# Patient Record
Sex: Female | Born: 1950 | Race: White | Hispanic: No | Smoking: Never smoker
Health system: Southern US, Community
[De-identification: ages and names within clinical notes are randomized; demographics above are authoritative.]

## PROBLEM LIST (undated history)

## (undated) DIAGNOSIS — T8859XA Other complications of anesthesia, initial encounter: Secondary | ICD-10-CM

## (undated) DIAGNOSIS — Z9889 Other specified postprocedural states: Secondary | ICD-10-CM

## (undated) DIAGNOSIS — Z8489 Family history of other specified conditions: Secondary | ICD-10-CM

## (undated) DIAGNOSIS — I728 Aneurysm of other specified arteries: Secondary | ICD-10-CM

## (undated) DIAGNOSIS — K760 Fatty (change of) liver, not elsewhere classified: Secondary | ICD-10-CM

## (undated) DIAGNOSIS — K219 Gastro-esophageal reflux disease without esophagitis: Secondary | ICD-10-CM

## (undated) DIAGNOSIS — J45909 Unspecified asthma, uncomplicated: Secondary | ICD-10-CM

## (undated) DIAGNOSIS — I4891 Unspecified atrial fibrillation: Secondary | ICD-10-CM

## (undated) DIAGNOSIS — F418 Other specified anxiety disorders: Secondary | ICD-10-CM

## (undated) HISTORY — DX: Unspecified atrial fibrillation: I48.91

## (undated) HISTORY — DX: Aneurysm of other specified arteries: I72.8

---

## 2021-02-22 ENCOUNTER — Other Ambulatory Visit: Payer: Self-pay

## 2021-02-22 ENCOUNTER — Emergency Department: Payer: Medicare Other

## 2021-02-22 ENCOUNTER — Observation Stay
Admission: EM | Admit: 2021-02-22 | Discharge: 2021-02-23 | Disposition: A | Discharge status: Home / Self Care | Payer: Medicare Other | Attending: Family Medicine | Admitting: Family Medicine

## 2021-02-22 DIAGNOSIS — Z79899 Other long term (current) drug therapy: Secondary | ICD-10-CM | POA: Insufficient documentation

## 2021-02-22 DIAGNOSIS — Z20822 Contact with and (suspected) exposure to covid-19: Secondary | ICD-10-CM | POA: Insufficient documentation

## 2021-02-22 DIAGNOSIS — I728 Aneurysm of other specified arteries: Secondary | ICD-10-CM | POA: Diagnosis not present

## 2021-02-22 DIAGNOSIS — J45909 Unspecified asthma, uncomplicated: Secondary | ICD-10-CM | POA: Diagnosis present

## 2021-02-22 DIAGNOSIS — K219 Gastro-esophageal reflux disease without esophagitis: Secondary | ICD-10-CM | POA: Diagnosis not present

## 2021-02-22 DIAGNOSIS — R109 Unspecified abdominal pain: Secondary | ICD-10-CM | POA: Diagnosis present

## 2021-02-22 HISTORY — DX: Gastro-esophageal reflux disease without esophagitis: K21.9

## 2021-02-22 HISTORY — DX: Unspecified asthma, uncomplicated: J45.909

## 2021-02-22 LAB — URINALYSIS, COMPLETE (UACMP) WITH MICROSCOPIC
Bacteria, UA: NONE SEEN
Bilirubin Urine: NEGATIVE
Glucose, UA: NEGATIVE mg/dL
Hgb urine dipstick: NEGATIVE
Ketones, ur: NEGATIVE mg/dL
Leukocytes,Ua: NEGATIVE
Nitrite: NEGATIVE
Protein, ur: NEGATIVE mg/dL
Specific Gravity, Urine: 1.004 — ABNORMAL LOW (ref 1.005–1.030)
Squamous Epithelial / HPF: NONE SEEN (ref 0–5)
pH: 6 (ref 5.0–8.0)

## 2021-02-22 LAB — CBC
HCT: 36.7 % (ref 36.0–46.0)
Hemoglobin: 12.1 g/dL (ref 12.0–15.0)
MCH: 29.9 pg (ref 26.0–34.0)
MCHC: 33 g/dL (ref 30.0–36.0)
MCV: 90.6 fL (ref 80.0–100.0)
Platelets: 266 10*3/uL (ref 150–400)
RBC: 4.05 MIL/uL (ref 3.87–5.11)
RDW: 12.5 % (ref 11.5–15.5)
WBC: 7.9 10*3/uL (ref 4.0–10.5)
nRBC: 0 % (ref 0.0–0.2)

## 2021-02-22 LAB — BASIC METABOLIC PANEL
Anion gap: 6 (ref 5–15)
BUN: 18 mg/dL (ref 8–23)
CO2: 26 mmol/L (ref 22–32)
Calcium: 9.1 mg/dL (ref 8.9–10.3)
Chloride: 104 mmol/L (ref 98–111)
Creatinine, Ser: 0.99 mg/dL (ref 0.44–1.00)
GFR, Estimated: >60 mL/min (ref >60)
Glucose, Bld: 117 mg/dL — ABNORMAL HIGH (ref 70–99)
Potassium: 3.8 mmol/L (ref 3.5–5.1)
Sodium: 136 mmol/L (ref 135–145)

## 2021-02-22 MED ORDER — OXYCODONE-ACETAMINOPHEN 5-325 MG PO TABS
1.0000 | ORAL_TABLET | Freq: Once | ORAL | Status: AC
  Administered 2021-02-22: 1 via ORAL
  Filled 2021-02-22: qty 1

## 2021-02-22 NOTE — ED Triage Notes (Signed)
Pt arrives via [facility] from home. Per report by medic, Left sided back pain x 2 days, steadily gotten worse. HR 76, BP 138/51, 99% RA. 12 lead unremarkable, pain to palpation. HX of diverticulitis.

## 2021-02-22 NOTE — ED Notes (Signed)
Patient transported to CT 

## 2021-02-22 NOTE — ED Triage Notes (Signed)
Pt presents to ER c/o left flank pain that started yesterday but has become worse today.  Pt denies having kidney stones in past.  Pt states pain radiates from left flank into abdomen.  Pt states her urine has developed a bad odor to it today.  Pt A&O x4 at this time.  Pt appears uncomfortable.

## 2021-02-23 ENCOUNTER — Encounter: Payer: Self-pay | Admitting: Radiology

## 2021-02-23 ENCOUNTER — Emergency Department: Payer: Medicare Other

## 2021-02-23 DIAGNOSIS — I728 Aneurysm of other specified arteries: Principal | ICD-10-CM

## 2021-02-23 DIAGNOSIS — K219 Gastro-esophageal reflux disease without esophagitis: Secondary | ICD-10-CM | POA: Diagnosis present

## 2021-02-23 DIAGNOSIS — J45909 Unspecified asthma, uncomplicated: Secondary | ICD-10-CM | POA: Diagnosis present

## 2021-02-23 LAB — BASIC METABOLIC PANEL
Anion gap: 4 — ABNORMAL LOW (ref 5–15)
BUN: 15 mg/dL (ref 8–23)
CO2: 30 mmol/L (ref 22–32)
Calcium: 9 mg/dL (ref 8.9–10.3)
Chloride: 104 mmol/L (ref 98–111)
Creatinine, Ser: 0.82 mg/dL (ref 0.44–1.00)
GFR, Estimated: >60 mL/min (ref >60)
Glucose, Bld: 104 mg/dL — ABNORMAL HIGH (ref 70–99)
Potassium: 4.1 mmol/L (ref 3.5–5.1)
Sodium: 138 mmol/L (ref 135–145)

## 2021-02-23 LAB — RESP PANEL BY RT-PCR (FLU A&B, COVID) ARPGX2
Influenza A by PCR: NEGATIVE
Influenza B by PCR: NEGATIVE
SARS Coronavirus 2 by RT PCR: NEGATIVE

## 2021-02-23 LAB — CBC
HCT: 36.5 % (ref 36.0–46.0)
Hemoglobin: 12.4 g/dL (ref 12.0–15.0)
MCH: 30.7 pg (ref 26.0–34.0)
MCHC: 34 g/dL (ref 30.0–36.0)
MCV: 90.3 fL (ref 80.0–100.0)
Platelets: 256 10*3/uL (ref 150–400)
RBC: 4.04 MIL/uL (ref 3.87–5.11)
RDW: 12.5 % (ref 11.5–15.5)
WBC: 6.7 10*3/uL (ref 4.0–10.5)
nRBC: 0 % (ref 0.0–0.2)

## 2021-02-23 LAB — HIV ANTIBODY (ROUTINE TESTING W REFLEX): HIV Screen 4th Generation wRfx: NONREACTIVE

## 2021-02-23 IMAGING — CT CT CTA ABD/PEL W/CM AND/OR W/O CM
2 of 9 series · 12 of 46 positions shown, 17 images · IV contrast (omnipaque)
Comparison: None.

CLINICAL DATA: Left upper quadrant abdominal pain, left flank pain

EXAM:
CTA ABDOMEN AND PELVIS WITHOUT AND WITH CONTRAST
TECHNIQUE: Multidetector CT imaging of the abdomen and pelvis was performed
using the standard protocol during bolus administration of
intravenous contrast. Multiplanar reconstructed images and MIPs were
obtained and reviewed to evaluate the vascular anatomy.
CONTRAST:  100mL OMNIPAQUE IOHEXOL 350 MG/ML SOLN

[Series 6: axial venous · axial · portal-venous · 0.69mm/px · z∈[-1171,-753]mm · 10 of 248 slices shown, 15 images]
[im 20/248  soft-tissue]
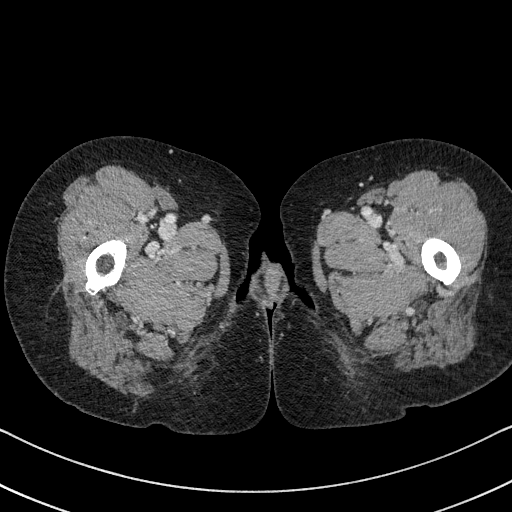
[im 20/248  bone]
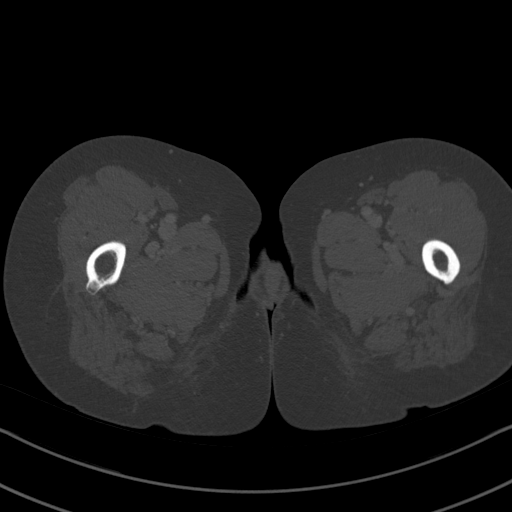
[im 58/248  soft-tissue]
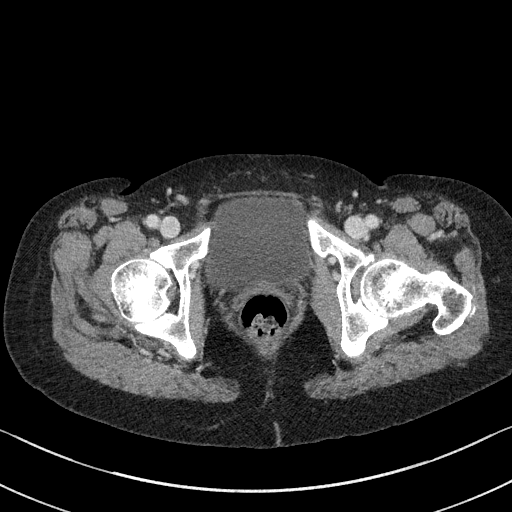
[im 77/248  soft-tissue]
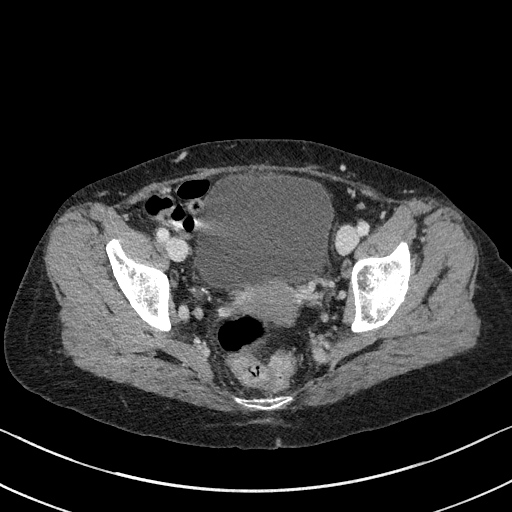
[im 96/248  soft-tissue]
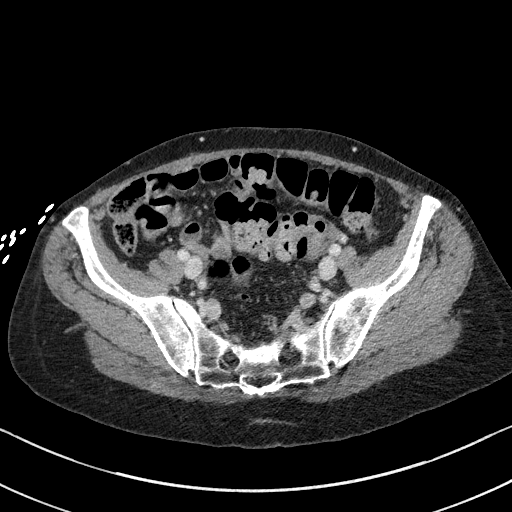
[im 134/248  soft-tissue]
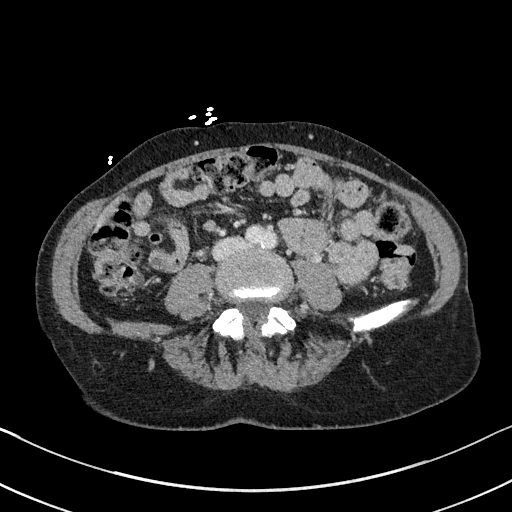
[im 153/248  soft-tissue]
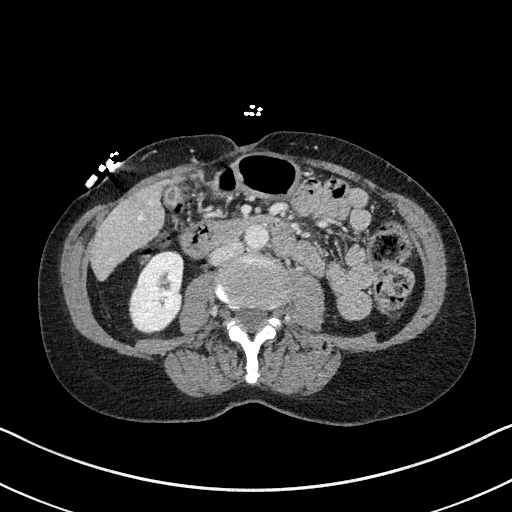
[im 172/248  soft-tissue]
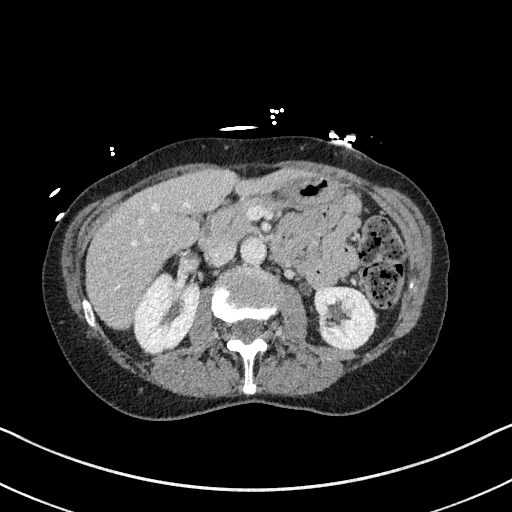
[im 172/248  lung]
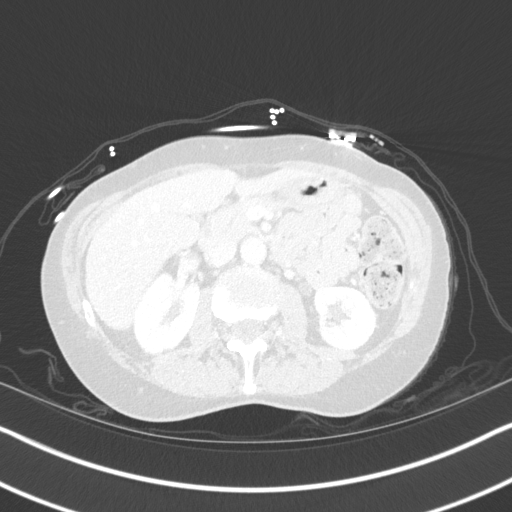
[im 191/248  lung]
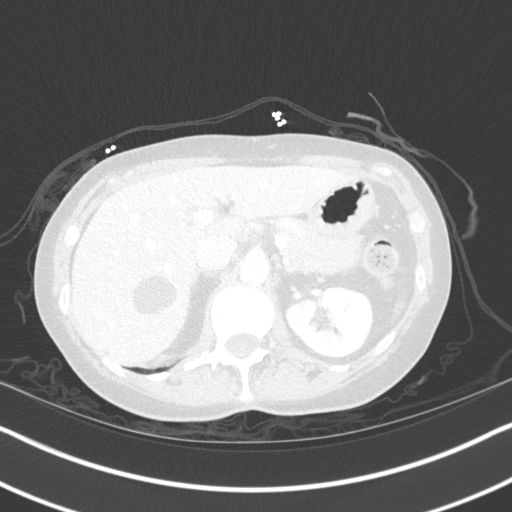
[im 210/248  soft-tissue]
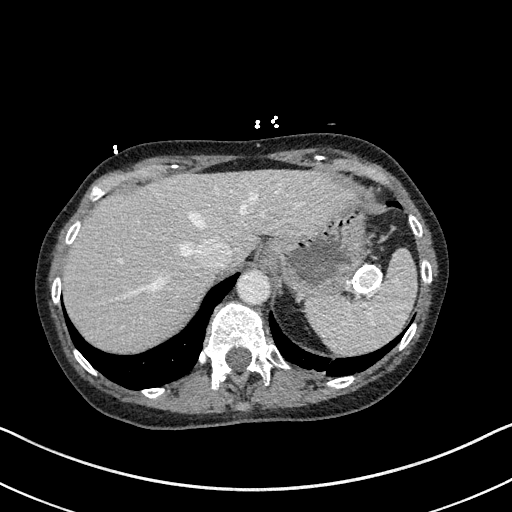
[im 210/248  lung]
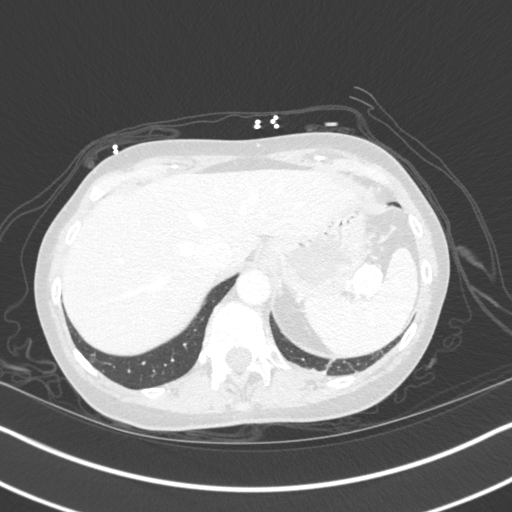
[im 229/248  soft-tissue]
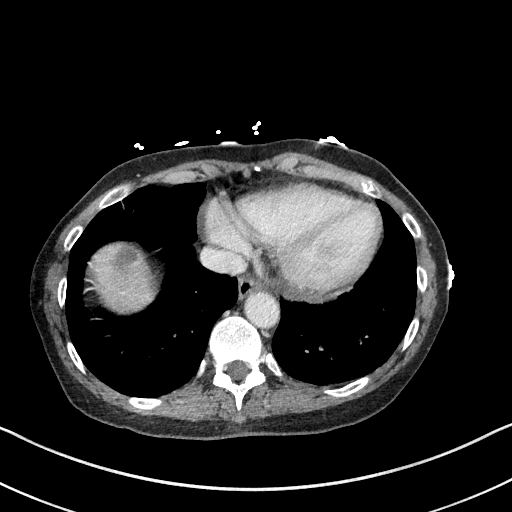
[im 229/248  lung]
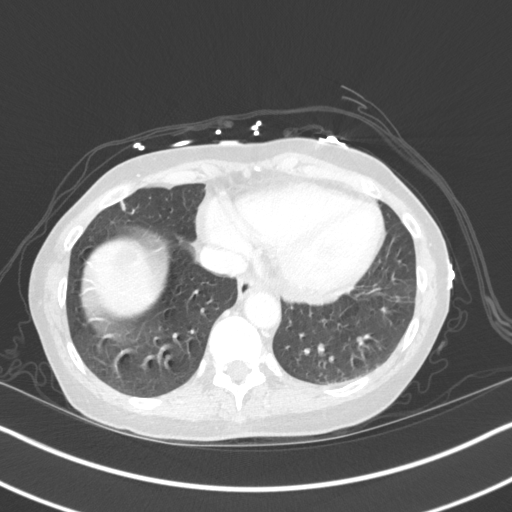
[im 229/248  bone]
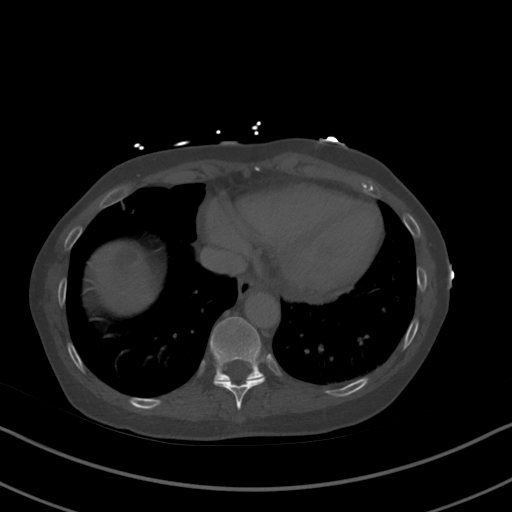

[Series 8: coronal mpr · coronal · 0.61mm/px · 2 of 108 slices shown]
[im 36/108  soft-tissue]
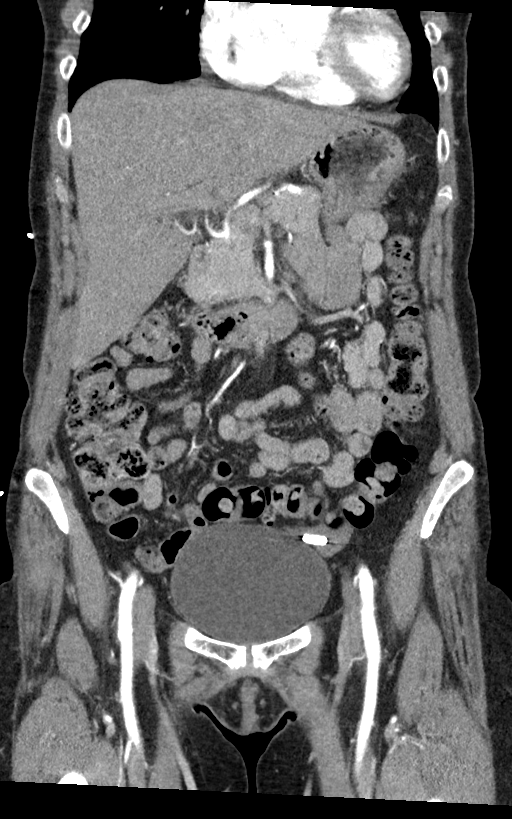
[im 72/108  soft-tissue]
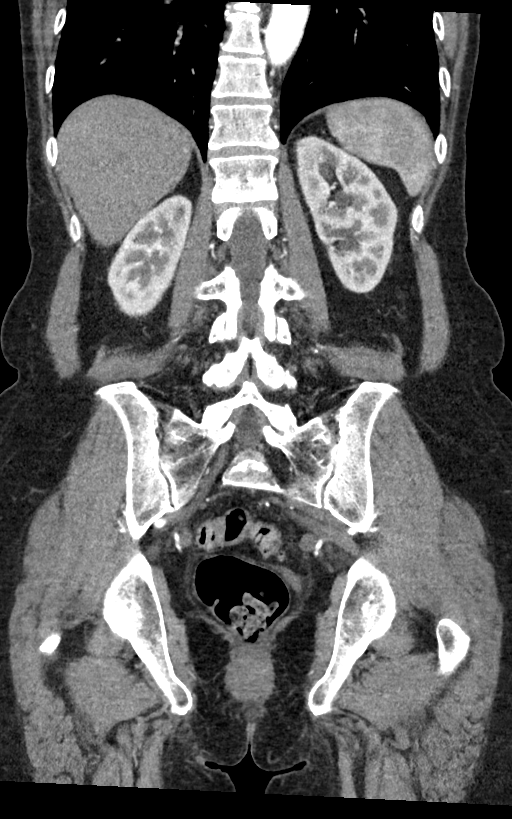

[12 of 46 positions shown; findings below may reference images not displayed]

FINDINGS: VASCULAR

Aorta: The abdominal aorta is of normal caliber. No aneurysm or
dissection. No periaortic inflammatory change.

Celiac: Widely patent. Normal anatomic configuration. There is a a
rim calcified pseudoaneurysm of the terminal splenic artery within
the splenic hilum measuring 18 mm x 18 mm x 23 mm in greatest
dimension. There is no extravasation identified. No perisplenic
fluid or ascites identified to suggest rupture.

SMA: Widely patent.  No aneurysm or dissection.

Renals: Single renal arteries are widely and demonstrate normal
vascular morphology. No aneurysm or dissection.

IMA: Widely patent

Inflow: Minimal atherosclerotic calcification. No aneurysm or
dissection. Internal iliac arteries are patent bilaterally.

Proximal Outflow: Widely patent

Veins: Unremarkable.

Review of the MIP images confirms the above findings.

NON-VASCULAR

Lower chest: No acute abnormality.

Hepatobiliary: Multiple simple hepatic cysts within the liver. The
liver is otherwise unremarkable. Gallbladder unremarkable. No intra
or extrahepatic biliary ductal dilation.

Pancreas: Unremarkable

Spleen: Aside from hilar aneurysm.  The spleen is unremarkable.

Adrenals/Urinary Tract: Adrenal glands are unremarkable. Kidneys are
normal, without renal calculi, focal lesion, or hydronephrosis.
Bladder is unremarkable.

Stomach/Bowel: Moderate sigmoid diverticulosis. Moderate stool
throughout the colon without evidence of obstruction. The stomach,
small bowel, and large bowel are otherwise unremarkable. No evidence
of obstruction or focal inflammation. Appendix normal. No free
intraperitoneal gas or fluid.

Lymphatic: No pathologic adenopathy within the abdomen and pelvis.

Reproductive: Tubal ligation clips are seen bilaterally. The pelvic
organs are otherwise unremarkable.

Other: Rectum unremarkable.  No abdominal wall hernia.

Musculoskeletal: No acute bone abnormality. No lytic or blastic bone
lesions are seen.
IMPRESSION: VASCULAR

Patency of a rim calcified 23 mm splenic artery aneurysm is
confirmed. There is, however, no evidence of rupture identified.
Interventional radiology consultation is recommended for further
management and possible embolization given the size of the aneurysm
and potential referable symptoms.

NON-VASCULAR

Moderate sigmoid diverticulosis.

Moderate stool throughout the colon without evidence of obstruction.

Aortic Atherosclerosis ([H8]-[H8]).

## 2021-02-23 MED ORDER — ONDANSETRON HCL 4 MG/2ML IJ SOLN
4.0000 mg | Freq: Once | INTRAMUSCULAR | Status: AC
  Administered 2021-02-23: 4 mg via INTRAVENOUS
  Filled 2021-02-23: qty 2

## 2021-02-23 MED ORDER — FENTANYL CITRATE (PF) 100 MCG/2ML IJ SOLN
50.0000 ug | INTRAMUSCULAR | Status: DC | PRN
  Administered 2021-02-23 (×3): 50 ug via INTRAVENOUS
  Filled 2021-02-23 (×3): qty 2

## 2021-02-23 MED ORDER — IOHEXOL 350 MG/ML SOLN
100.0000 mL | Freq: Once | INTRAVENOUS | Status: AC | PRN
  Administered 2021-02-23: 100 mL via INTRAVENOUS

## 2021-02-23 MED ORDER — FENTANYL CITRATE (PF) 100 MCG/2ML IJ SOLN
50.0000 ug | Freq: Once | INTRAMUSCULAR | Status: AC
  Administered 2021-02-23: 50 ug via INTRAVENOUS
  Filled 2021-02-23: qty 2

## 2021-02-23 MED ORDER — ACETAMINOPHEN 325 MG PO TABS
650.0000 mg | ORAL_TABLET | Freq: Four times a day (QID) | ORAL | Status: DC | PRN
  Administered 2021-02-23: 650 mg via ORAL
  Filled 2021-02-23: qty 2

## 2021-02-23 MED ORDER — LACTATED RINGERS IV SOLN: INTRAVENOUS | Status: DC

## 2021-02-23 MED ORDER — CYCLOBENZAPRINE HCL 5 MG PO TABS: 5.0000 mg | ORAL_TABLET | Freq: Three times a day (TID) | ORAL | 0 refills | Status: DC | PRN

## 2021-02-23 MED ORDER — LACTATED RINGERS IV BOLUS: 1000.0000 mL | Freq: Once | INTRAVENOUS | Status: DC

## 2021-02-23 MED ORDER — ALBUTEROL SULFATE (2.5 MG/3ML) 0.083% IN NEBU: 2.5000 mg | INHALATION_SOLUTION | Freq: Four times a day (QID) | RESPIRATORY_TRACT | Status: DC | PRN

## 2021-02-23 NOTE — Progress Notes (Signed)
AVE reviewed and pt verbalized understanding of all instructions. NAD noted prior to discharge and no concerns voiced.

## 2021-02-23 NOTE — Consult Note (Signed)
Chief Complaint: Flank pain, Abd pain  Referring Physician(s): Dr. Maryfrances Bunnell  Supervising Physician: Gilmer Mor  Patient Status: [facility] - In-pt  History of Present Illness: Samantha Roy is a 70 y.o. female  admitted to [facility] through the ED with onset of left flank pain and abdominal pain.   She tells me that Monday of this week she started to have episodes of left flank pain that moved into her low abdomen, worsening through Tuesday such that she had her friend/neighbor call EMS.   She describes the pain starting in her left back/flank and moving in a colic pattern anterioly and low into her pelvis.  Intensity is 10/10.  She has associated symptom of decreased appetite and a few episodes of diarrhea in the past few days.    She denies any nausea/vomiting.  Denies blood in her stool.    She has a medical history that includes a diagnosis in [place] of IBS, telling me that she had biopsies performed to rule out inflammatory bowel disease, with negative work up.  Typically she moves her bowels daily, without chronic constipation.  She also has known diverticular disease.  She has GI care here in [place].    On her ED admission, renal protocol CT showed no kidney stones, but did show a partially calcified splenic artery aneurysm.  CTA followed.  This shows an aneurysm in the hilum of the spleen, by my measurement on coronal images ~95mm.  Nearly completely rim calcified, with no inflammation or fluid.  No evidence of hemorrhage.   She denies history of trauma, and denies any prior pancreatitis.    She is not treated for hypertension.  No history of smoking, ETOH, or drug use.  She does have a family history positive for CV disease, regarding several MI's in her family, and she did tell me that her brother died of "something to do with the arteries."     Past Medical History:  Diagnosis Date   Asthma    GERD (gastroesophageal reflux disease)     Past Surgical History:  Procedure  Laterality Date   CESAREAN SECTION      Allergies: Bee venom, Codeine, and Latex  Medications: Prior to Admission medications   Medication Sig Start Date End Date Taking? Authorizing Provider  albuterol (VENTOLIN HFA) 108 (90 Base) MCG/ACT inhaler Inhale 2 puffs into the lungs every 6 (six) hours as needed.   Yes [provider]  esomeprazole (NEXIUM) 20 MG capsule Take 1 capsule by mouth daily. 12/30/20  Yes [provider]     Family History  Problem Relation Age of Onset   Aneurysm Neg Hx     Social History   Socioeconomic History   Marital status: Single    Spouse name: Not on file   Number of children: Not on file   Years of education: Not on file   Highest education level: Not on file  Occupational History   Not on file  Tobacco Use   Smoking status: Never   Smokeless tobacco: Never  Substance and Sexual Activity   Alcohol use: Never   Drug use: Never   Sexual activity: Not on file  Other Topics Concern   Not on file  Social History Narrative   Not on file   Social Determinants of Health   Financial Resource Strain: Not on file  Food Insecurity: Not on file  Transportation Needs: Not on file  Physical Activity: Not on file  Stress: Not on file  Social Connections:  Not on file       Review of Systems: A 12 point ROS discussed and pertinent positives are indicated in the HPI above.  All other systems are negative.  Review of Systems  Vital Signs: BP 133/66 (BP Location: Right Arm)   Pulse 77   Temp 98.2 F (36.8 C) (Oral)   Resp 18   Ht 5\' 4"  (1.626 m)   Wt 59 kg   SpO2 98%   BMI 22.31 kg/m   Physical Exam General: 70 yo female appearing younger stated age.  Well-developed, well-nourished.  No distress. HEENT: Atraumatic, normocephalic.  Conjugate gaze, extra-ocular motor intact. No scleral icterus or scleral injection. No lesions on external ears, nose, lips, or gums.  Oral mucosa moist, pink.  Neck: Symmetric with no goiter  enlargement.  Chest/Lungs:  Symmetric chest with inspiration/expiration.  No labored breathing.  Clear to auscultation with no wheezes, rhonchi, or rales.  Heart:  RRR, with no third heart sounds appreciated. No JVD appreciated.  Abdomen:  Tender to palpation of the LLQ.  No peritoneal signs.  Some reproducible pain with percussion of the left flank, with pain in the abdomen.  No right sided pain.  Bowel sounds positive. No distention.  Genito-urinary: Deferred Neurologic: Alert & Oriented to person, place, and time.   Normal affect and insight.  Appropriate questions.  Moving all 4 extremities with gross sensory intact.  Pulse Exam:  Palpable distal pulses bilateral.     Imaging: CT Renal Stone Study  Result Date: 02/22/2021 CLINICAL DATA:  Flank pain, kidney stone suspected. Pt presents to ER c/o left flank pain that started yesterday but has become worse today. Pt denies having kidney stones in past. Pt states pain radiates from left flank into abdomen EXAM: CT ABDOMEN AND PELVIS WITHOUT CONTRAST TECHNIQUE: Multidetector CT imaging of the abdomen and pelvis was performed following the standard protocol without IV contrast. COMPARISON:  None. FINDINGS: Lower chest: No acute abnormality. Hepatobiliary: Multiple simple cysts are seen scattered throughout the liver. The liver is otherwise unremarkable. No intra or extrahepatic biliary ductal dilation. Gallbladder unremarkable. Pancreas: Unremarkable Spleen: A 19 x 19 x 27 mm rim calcified structure is seen within the splenic hilum most likely representing a distal splenic artery aneurysm. Patency is not well assessed on this noncontrast examination. Adrenals/Urinary Tract: Adrenal glands are unremarkable. Kidneys are normal, without renal calculi, focal lesion, or hydronephrosis. Bladder is unremarkable. Stomach/Bowel: There is moderate stool seen throughout the colon. There is moderate sigmoid diverticulosis without superimposed focal inflammatory change  identified. The stomach, small bowel, and large bowel are otherwise unremarkable. Appendix normal. No free intraperitoneal gas or fluid. Vascular/Lymphatic: Mild aortoiliac atherosclerotic calcification. No aortic aneurysm. The left gonadal vein is dilated proximally, but is not well evaluated due to noncontrast technique. No pathologic adenopathy within the abdomen and pelvis. Reproductive: Tubal ligation clips are seen within the adnexa bilaterally. The pelvic organs are otherwise unremarkable. Other: No abdominal wall hernia.  The rectum is unremarkable. Musculoskeletal: No acute bone abnormality. No lytic or blastic bone lesion. IMPRESSION: 27 mm rim calcified structure within the splenic hilum concerning for a distal splenic artery aneurysm. CT arteriography is recommended for further evaluation given the patient's reported left flank pain. No perisplenic fluid or free intraperitoneal fluid at this time to suggest rupture. Moderate sigmoid diverticulosis. Moderate stool throughout the colon without evidence of obstruction. Aortic Atherosclerosis (ICD10-I70.0). Electronically Signed   By: Helyn NumbersAshesh  Parikh MD   On: 02/22/2021 23:30   CT Angio Abd/Pel  W and/or Wo Contrast  Result Date: 02/23/2021 CLINICAL DATA:  Left upper quadrant abdominal pain, left flank pain EXAM: CTA ABDOMEN AND PELVIS WITHOUT AND WITH CONTRAST TECHNIQUE: Multidetector CT imaging of the abdomen and pelvis was performed using the standard protocol during bolus administration of intravenous contrast. Multiplanar reconstructed images and MIPs were obtained and reviewed to evaluate the vascular anatomy. CONTRAST:  OMNIPAQUE IOHEXOL 350 MG/ML SOLN COMPARISON:  None. FINDINGS: VASCULAR Aorta: The abdominal aorta is of normal caliber. No aneurysm or dissection. No periaortic inflammatory change. Celiac: Widely patent. Normal anatomic configuration. 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 There is no extravasation identified. No perisplenic fluid or ascites identified to suggest rupture. SMA: Widely patent.  No aneurysm or dissection. Renals: Single renal arteries are widely and demonstrate normal vascular morphology. No aneurysm or dissection. IMA: Widely patent Inflow: Minimal atherosclerotic calcification. No aneurysm or dissection. Internal iliac arteries are patent bilaterally. Proximal Outflow: Widely patent Veins: Unremarkable. Review of the MIP images confirms the above findings. NON-VASCULAR Lower chest: No acute abnormality. Hepatobiliary: Multiple simple hepatic cysts within the liver. The liver is otherwise unremarkable. Gallbladder unremarkable. No intra or extrahepatic biliary ductal dilation. 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  The spleen is unremarkable. Adrenals/Urinary Tract: Adrenal glands are unremarkable. Kidneys are normal, without renal calculi, focal lesion, or hydronephrosis. Bladder is unremarkable. Stomach/Bowel: Moderate sigmoid diverticulosis. Moderate stool throughout the colon without evidence of obstruction. The stomach, small bowel, and large bowel are otherwise unremarkable. No evidence of obstruction or focal inflammation. Appendix normal. No free intraperitoneal gas or fluid. Lymphatic: No pathologic adenopathy within the abdomen and pelvis. Reproductive: Tubal ligation clips are seen bilaterally. The pelvic organs are otherwise unremarkable. Other: Rectum unremarkable.  No abdominal wall hernia. Musculoskeletal: No acute bone abnormality. No lytic or blastic bone lesions are seen. 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 There is, however, no evidence of rupture identified. Interventional radiology consultation is recommended for further management and possible embolization given the size of the aneurysm and potential referable  symptoms. NON-VASCULAR Moderate sigmoid diverticulosis. Moderate stool throughout the colon without evidence of obstruction. Aortic Atherosclerosis (ICD10-I70.0). Electronically Signed   By: Helyn Numbers MD   On: 02/23/2021 01:32    Labs:  CBC: Recent Labs    02/22/21 2249 02/23/21 0540  WBC 7.9 6.7  HGB 12.1 12.4  HCT 36.7 36.5  PLT 266 256    COAGS: No results for input(s): INR, APTT in the last 8760 hours.  BMP: Recent Labs    02/22/21 2249 02/23/21 0540  NA 136 138  K 3.8 4.1  CL 104 104  CO2 26 30  GLUCOSE 117* 104*  BUN 18 15  CALCIUM 9.1 9.0  CREATININE 0.99 0.82  GFRNONAA >60 >60    LIVER FUNCTION TESTS: No results for input(s): BILITOT, AST, ALT, ALKPHOS, PROT, ALBUMIN in the last 8760 hours.  TUMOR MARKERS: No results for input(s): AFPTM, CEA, CA199, CHROMGRNA in the last 8760 hours.  Assessment and Plan:   Ms Ramer is a 70 yo female with admission for left flank/abdominal pain, and a splenic artery aneurysm identified on the CT through the ED.   The splenic artery aneurysm is ~57mm by my measurement, with protective features of rim calcification, and negative for signs of inflammation/edema/hemorrhage.    Her pain seems  more abdominal/pelvic, as she describes a colic type pain moving towards her pelvis, with TTP in the LLQ.  She does have history of IBS.   I had a lengthy discussion with her regarding the anatomy, pathology, pathophysiology of aneurysms and visceral aneurysm specifically, including a discussion regarding the typical treatment of visceral aneurysms beyond 2.5cm.  I did let her know that I do not think that her current symptoms are secondary to the presence of the aneurysm.    Ordinarily, a splenic artery aneurysm of this size, with these features, in a woman of her age without hypertension would be observed for any growth.  I did let her know that typical growth trajectory is slow, less than 61mm annually.  I did let her know that  typically the natural history is a benign, asymptomatic course, with a rupture rate ~5%.  However, the mortality if a rupture occurs is higher given the size.  She understands.    I think if this were to be treated with embolization, there would be a large splenic infarction which would prompt a longer hospitalization, and put her at risk for spleen infection/abscess.  This is in addition to the typical risks of angiogram and intervention.    My plan would be to observe to see if her abdominal pain improves.  This could be either in-patient, or once her goals of care are reached her in the hospital, as an out-patient, with the acceptance that she would have to come back for therapy if worsening occurs, or other symptoms such as acute pain with weakness, syncope, etc, which could indicate rupture.     She understands.   I have discussed with Dr. Maryfrances Bunnell.  Plan: - No indication for immediate intervention - Would observe for improving abd pain - VIR available if any sudden change.  We will follow here - She requested referral to [facility], as her care is with [facility] already.  I am happy to get her connected.  If she needs our [facility] office number for anything, [phone].   Thank you for this interesting consult.  I greatly enjoyed meeting Samantha Roy and look forward to participating in their care.  A copy of this report was sent to the requesting provider on this date.  Electronically Signed: Gilmer Mor, DO 02/23/2021, 11:30 AM   I spent a total of 80 Minutes    in face to face in clinical consultation, greater than 50% of which was counseling/coordinating care for abdominal pain, splenic artery aneurysm, possible intervention

## 2021-02-23 NOTE — Discharge Summary (Signed)
Physician Discharge Summary  Samantha Roy KDX:[id number] DOB: Aug 22, 1950 DOA: 02/22/2021  PCP: Elyse Hsu, MD  Admit date: 02/22/2021 Discharge date: 02/23/2021  Admitted From: Home  Disposition:  Home   Recommendations for Outpatient Follow-up:  Follow up with PCP Dr. Noreene Filbert in 1-2 weeks Dr. Noreene Filbert: Please assist patient to follow up with [facility], referral made      Home Health: None  Equipment/Devices: None new  Discharge Condition: Good  CODE STATUS: FULL Diet recommendation: Regular  Brief/Interim Summary: [name] is a 70 y.o. F with GERD, IBS who presented with left flank pain over the last few days.  Initially the patient had some right-sided abdominal pain, which was intermittent and brief and sharp, resolved by itself.  Then over the last few days, discomfort returned, this time in the left side, at times severe and radiating to the front of the abdomen, not associated with nausea, vomiting, or change in bowel movements.  In the ER patient had a noncontrasted CT which ruled out radiopaque kidney stone, and urinalysis showed no hematuria.  There is a suspicion for splenic artery aneurysm, so CT angiogram was obtained that confirmed a small splenic artery aneurysm, no other evidence of intra-abdominal bleeding, mass, diverticulitis, bowel obstruction.  White count was normal.  Patient was admitted for observation.     PRINCIPAL HOSPITAL DIAGNOSIS: Abdominal pain, incidental splenic artery aneurysm    Discharge Diagnoses:  Abdominal pain Unclear cause, this may be a pulled muscle in her back, it may be IBS.  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  No white count, no fever chills Sister/diverticulitis.  The patient was observed overnight, her pain resolved with Tylenol only, she was able to tolerate an oral diet without  symptoms.     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  [facility] has contacted [facility] for post hospital follow-up.   IBS  Asthma          Discharge Instructions  Discharge Instructions     Discharge instructions   Complete by: As directed    From Dr. Maryfrances Bunnell: You were admitted for abdominal pain On your imaging, we found that you had a "splenic artery aneurysm". This is safe to watch, because these are slow-changing aneurysms, and may never need treatment. Go see [facility] Dr. Gilmer Mor has made this referral, and they should reach out to you If you haven't heard from them, he has sent the [facility] office number to your MyChart  For the back strain, feel free to try the cyclobenzaprine/Flexeril 5 mg up to three times daily   Increase activity slowly   Complete by: As directed       Allergies as of 02/23/2021       Reactions   Bee Venom Anaphylaxis   Codeine Swelling   Latex Rash        Medication List     TAKE these medications    albuterol 108 (90 Base) MCG/ACT inhaler Commonly known as: VENTOLIN HFA Inhale 2 puffs into the lungs every 6 (six) hours as needed.   cyclobenzaprine 5 MG tablet Commonly known as: FLEXERIL Take 1 tablet (5 mg total) by mouth 3 (three) times daily as needed for muscle spasms.   esomeprazole 20 MG capsule Commonly known as: NEXIUM Take 1  capsule by mouth daily.        Allergies  Allergen Reactions   Bee Venom Anaphylaxis   Codeine Swelling   Latex Rash    Consultations: Interventional radiology   Procedures/Studies: CT Renal Stone Study  Result Date: 02/22/2021 CLINICAL DATA:  Flank pain, kidney stone suspected. Pt presents to ER c/o left flank pain that started yesterday but has become worse today. Pt denies  having kidney stones in past. Pt states pain radiates from left flank into abdomen EXAM: CT ABDOMEN AND PELVIS WITHOUT CONTRAST TECHNIQUE: Multidetector CT imaging of the abdomen and pelvis was performed following the standard protocol without IV contrast. COMPARISON:  None. FINDINGS: Lower chest: No acute abnormality. Hepatobiliary: Multiple simple cysts are seen scattered throughout the liver. The liver is otherwise unremarkable. No intra or extrahepatic biliary ductal dilation. Gallbladder unremarkable. Pancreas: Unremarkable Spleen: A 19 x 19 x 27 mm rim calcified structure is seen within the splenic hilum most likely representing a distal splenic artery aneurysm. Patency is not well assessed on this noncontrast examination. Adrenals/Urinary Tract: Adrenal glands are unremarkable. Kidneys are normal, without renal calculi, focal lesion, or hydronephrosis. Bladder is unremarkable. Stomach/Bowel: There is moderate stool seen throughout the colon. There is moderate sigmoid diverticulosis without superimposed focal inflammatory change identified. The stomach, small bowel, and large bowel are otherwise unremarkable. Appendix normal. No free intraperitoneal gas or fluid. Vascular/Lymphatic: Mild aortoiliac atherosclerotic calcification. No aortic aneurysm. The left gonadal vein is dilated proximally, but is not well evaluated due to noncontrast technique. No pathologic adenopathy within the abdomen and pelvis. Reproductive: Tubal ligation clips are seen within the adnexa bilaterally. The pelvic organs are otherwise unremarkable. Other: No abdominal wall hernia.  The rectum is unremarkable. Musculoskeletal: No acute bone abnormality. No lytic or blastic bone lesion. IMPRESSION: 27 mm rim calcified structure within the splenic hilum concerning for a distal splenic artery aneurysm. CT arteriography is recommended for further evaluation given the patient's reported left flank pain. No perisplenic fluid or free  intraperitoneal fluid at this time to suggest rupture. Moderate sigmoid diverticulosis. Moderate stool throughout the colon without evidence of obstruction. Aortic Atherosclerosis (ICD10-I70.0). Electronically Signed   By: Helyn Numbers MD   On: 02/22/2021 23:30   CT Angio Abd/Pel W and/or Wo Contrast  Result Date: 02/23/2021 CLINICAL DATA:  Left upper quadrant abdominal pain, left flank pain EXAM: CTA ABDOMEN AND PELVIS WITHOUT AND WITH CONTRAST TECHNIQUE: Multidetector CT imaging of the abdomen and pelvis was performed using the standard protocol during bolus administration of intravenous contrast. Multiplanar reconstructed images and MIPs were obtained and reviewed to evaluate the vascular anatomy. CONTRAST:  OMNIPAQUE IOHEXOL 350 MG/ML SOLN COMPARISON:  None. FINDINGS: VASCULAR Aorta: The abdominal aorta is of normal caliber. No aneurysm or dissection. No periaortic inflammatory change. Celiac: Widely patent. Normal anatomic configuration. 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 There is no extravasation identified. No perisplenic fluid or ascites identified to suggest rupture. SMA: Widely patent.  No aneurysm or dissection. Renals: Single renal arteries are widely and demonstrate normal vascular morphology. No aneurysm or dissection. IMA: Widely patent Inflow: Minimal atherosclerotic calcification. No aneurysm or dissection. Internal iliac arteries are patent bilaterally. Proximal Outflow: Widely patent Veins: Unremarkable. Review of the MIP images confirms the above findings. NON-VASCULAR Lower chest: No acute abnormality. Hepatobiliary: Multiple simple hepatic cysts within the liver. The liver is otherwise unremarkable. Gallbladder  unremarkable. No intra or extrahepatic biliary ductal dilation. 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  The spleen is unremarkable.  Adrenals/Urinary Tract: Adrenal glands are unremarkable. Kidneys are normal, without renal calculi, focal lesion, or hydronephrosis. Bladder is unremarkable. Stomach/Bowel: Moderate sigmoid diverticulosis. Moderate stool throughout the colon without evidence of obstruction. The stomach, small bowel, and large bowel are otherwise unremarkable. No evidence of obstruction or focal inflammation. Appendix normal. No free intraperitoneal gas or fluid. Lymphatic: No pathologic adenopathy within the abdomen and pelvis. Reproductive: Tubal ligation clips are seen bilaterally. The pelvic organs are otherwise unremarkable. Other: Rectum unremarkable.  No abdominal wall hernia. Musculoskeletal: No acute bone abnormality. No lytic or blastic bone lesions are seen. 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 There is, however, no evidence of rupture identified. Interventional radiology consultation is recommended for further management and possible embolization given the size of the aneurysm and potential referable symptoms. NON-VASCULAR Moderate sigmoid diverticulosis. Moderate stool throughout the colon without evidence of obstruction. Aortic Atherosclerosis (ICD10-I70.0). Electronically Signed   By: Helyn NumbersAshesh  Parikh MD   On: 02/23/2021 01:32      Subjective: She has mild "pinching" in her left flank, no fever, change in bowel symptoms, vomiting.  Pinching in her back seems to her muscle related, not worsened with movement, eating.  Discharge Exam: Vitals:   02/23/21 0739 02/23/21 1509  BP: 133/66 (!) 150/61  Pulse: 77 62  Resp: 18 18  Temp: 98.2 F (36.8 C) 97.9 F (36.6 C)  SpO2: 98% 100%   Vitals:   02/23/21 0245 02/23/21 0431 02/23/21 0739 02/23/21 1509  BP: 128/60 134/63 133/66 (!) 150/61  Pulse: 74 64 77 62  Resp: 14  18 18   Temp: 98 F (36.7 C) 98 F (36.7 C) 98.2 F (36.8 C) 97.9 F (36.6 C)  TempSrc: Oral Oral Oral Oral  SpO2: 98% 99% 98% 100%   Weight:      Height:        General: t is alert, awake, not in acute distress Cardiovascular: RRR, nl S1-S2, no murmurs appreciated.   No LE edema.   Respiratory: Normal respiratory rate and rhythm.  CTAB without rales or wheezes. Abdominal: Abdomen soft and non-tender.  No distension or HSM.   Neuro/Psych: Strength symmetric in upper and lower extremities.  Judgment and insight appear normal.   The results of significant diagnostics from this hospitalization (including imaging, microbiology, ancillary and laboratory) are listed below for reference.     Microbiology: Recent Results (from the past 240 hour(s))  Resp Panel by RT-PCR (Flu A&B, Covid) Nasopharyngeal Swab     Status: None   Collection Time: 02/23/21  1:38 AM   Specimen: Nasopharyngeal Swab; Nasopharyngeal(NP) swabs in vial transport medium  Result Value Ref Range Status   SARS Coronavirus 2 by RT PCR NEGATIVE NEGATIVE Final    Comment: (NOTE) SARS-CoV-2 target nucleic acids are NOT DETECTED.  The SARS-CoV-2 RNA is generally detectable in upper respiratory specimens during the acute phase of infection. The lowest concentration of SARS-CoV-2 viral copies this assay can detect is 138 copies/mL. A negative result does not preclude SARS-Cov-2 infection and should not be used as the sole basis for treatment or other patient management decisions. A negative result may occur with  improper specimen collection/handling, submission of specimen other than nasopharyngeal swab, presence of viral mutation(s) within the areas targeted by this assay, and inadequate number of viral copies(<138 copies/mL). A negative result must be combined with  clinical observations, patient history, and epidemiological information. The expected result is Negative.  Fact Sheet for Patients:  BloggerCourse.com  Fact Sheet for Healthcare Providers:  SeriousBroker.it  This test is no t yet approved  or cleared by the [place] FDA and  has been authorized for detection and/or diagnosis of SARS-CoV-2 by FDA under an Emergency Use Authorization (EUA). This EUA will remain  in effect (meaning this test can be used) for the duration of the COVID-19 declaration under Section 564(b)(1) of the Act, 21 U.S.C.section 360bbb-3(b)(1), unless the authorization is terminated  or revoked sooner.       Influenza A by PCR NEGATIVE NEGATIVE Final   Influenza B by PCR NEGATIVE NEGATIVE Final    Comment: (NOTE) The Xpert Xpress SARS-CoV-2/FLU/RSV plus assay is intended as an aid in the diagnosis of influenza from Nasopharyngeal swab specimens and should not be used as a sole basis for treatment. Nasal washings and aspirates are unacceptable for Xpert Xpress SARS-CoV-2/FLU/RSV testing.  Fact Sheet for Patients: BloggerCourse.com  Fact Sheet for Healthcare Providers: SeriousBroker.it  This test is not yet approved or cleared by the [place] FDA and has been authorized for detection and/or diagnosis of SARS-CoV-2 by FDA under an Emergency Use Authorization (EUA). This EUA will remain in effect (meaning this test can be used) for the duration of the COVID-19 declaration under Section 564(b)(1) of the Act, 21 U.S.C. section 360bbb-3(b)(1), unless the authorization is terminated or revoked.  Performed at [facility], [address] [place], [place] [address]      Labs: BNP (last 3 results) No results for input(s): BNP in the last 8760 hours. Basic Metabolic Panel: Recent Labs  Lab 02/22/21 2249 02/23/21 0540  NA 136 138  K 3.8 4.1  CL 104 104  CO2 26 30  GLUCOSE 117* 104*  BUN 18 15  CREATININE 0.99 0.82  CALCIUM 9.1 9.0   Liver Function Tests: No results for input(s): AST, ALT, ALKPHOS, BILITOT, PROT, ALBUMIN in the last 168 hours. No results for input(s): LIPASE, AMYLASE in the last 168 hours. No results for  input(s): AMMONIA in the last 168 hours. CBC: Recent Labs  Lab 02/22/21 2249 02/23/21 0540  WBC 7.9 6.7  HGB 12.1 12.4  HCT 36.7 36.5  MCV 90.6 90.3  PLT 266 256   Cardiac Enzymes: No results for input(s): CKTOTAL, CKMB, CKMBINDEX, TROPONINI in the last 168 hours. BNP: Invalid input(s): POCBNP CBG: No results for input(s): GLUCAP in the last 168 hours. D-Dimer No results for input(s): DDIMER in the last 72 hours. Hgb A1c No results for input(s): HGBA1C in the last 72 hours. Lipid Profile No results for input(s): CHOL, HDL, LDLCALC, TRIG, CHOLHDL, LDLDIRECT in the last 72 hours. Thyroid function studies No results for input(s): TSH, T4TOTAL, T3FREE, THYROIDAB in the last 72 hours.  Invalid input(s): FREET3 Anemia work up No results for input(s): VITAMINB12, FOLATE, FERRITIN, TIBC, IRON, RETICCTPCT in the last 72 hours. Urinalysis    Component Value Date/Time   COLORURINE STRAW (A) 02/22/2021 2249   APPEARANCEUR CLEAR (A) 02/22/2021 2249   LABSPEC 1.004 (L) 02/22/2021 2249   PHURINE 6.0 02/22/2021 2249   GLUCOSEU NEGATIVE 02/22/2021 2249   HGBUR NEGATIVE 02/22/2021 2249   BILIRUBINUR NEGATIVE 02/22/2021 2249   KETONESUR NEGATIVE 02/22/2021 2249   PROTEINUR NEGATIVE 02/22/2021 2249   NITRITE NEGATIVE 02/22/2021 2249   LEUKOCYTESUR NEGATIVE 02/22/2021 2249   Sepsis Labs Invalid input(s): PROCALCITONIN,  WBC,  LACTICIDVEN Microbiology Recent Results (from the past  240 hour(s))  Resp Panel by RT-PCR (Flu A&B, Covid) Nasopharyngeal Swab     Status: None   Collection Time: 02/23/21  1:38 AM   Specimen: Nasopharyngeal Swab; Nasopharyngeal(NP) swabs in vial transport medium  Result Value Ref Range Status   SARS Coronavirus 2 by RT PCR NEGATIVE NEGATIVE Final    Comment: (NOTE) SARS-CoV-2 target nucleic acids are NOT DETECTED.  The SARS-CoV-2 RNA is generally detectable in upper respiratory specimens during the acute phase of infection. The lowest concentration of  SARS-CoV-2 viral copies this assay can detect is 138 copies/mL. A negative result does not preclude SARS-Cov-2 infection and should not be used as the sole basis for treatment or other patient management decisions. A negative result may occur with  improper specimen collection/handling, submission of specimen other than nasopharyngeal swab, presence of viral mutation(s) within the areas targeted by this assay, and inadequate number of viral copies(<138 copies/mL). A negative result must be combined with clinical observations, patient history, and epidemiological information. The expected result is Negative.  Fact Sheet for Patients:  BloggerCourse.com  Fact Sheet for Healthcare Providers:  SeriousBroker.it  This test is no t yet approved or cleared by the [place] FDA and  has been authorized for detection and/or diagnosis of SARS-CoV-2 by FDA under an Emergency Use Authorization (EUA). This EUA will remain  in effect (meaning this test can be used) for the duration of the COVID-19 declaration under Section 564(b)(1) of the Act, 21 U.S.C.section 360bbb-3(b)(1), unless the authorization is terminated  or revoked sooner.       Influenza A by PCR NEGATIVE NEGATIVE Final   Influenza B by PCR NEGATIVE NEGATIVE Final    Comment: (NOTE) The Xpert Xpress SARS-CoV-2/FLU/RSV plus assay is intended as an aid in the diagnosis of influenza from Nasopharyngeal swab specimens and should not be used as a sole basis for treatment. Nasal washings and aspirates are unacceptable for Xpert Xpress SARS-CoV-2/FLU/RSV testing.  Fact Sheet for Patients: BloggerCourse.com  Fact Sheet for Healthcare Providers: SeriousBroker.it  This test is not yet approved or cleared by the [place] FDA and has been authorized for detection and/or diagnosis of SARS-CoV-2 by FDA under an Emergency Use  Authorization (EUA). This EUA will remain in effect (meaning this test can be used) for the duration of the COVID-19 declaration under Section 564(b)(1) of the Act, 21 U.S.C. section 360bbb-3(b)(1), unless the authorization is terminated or revoked.  Performed at [facility], [address] [place], [place] [address]      Time coordinating discharge: 35 minutes         SIGNED:   Alberteen Sam, MD  [facility] 02/23/2021, 5:00 PM

## 2021-02-23 NOTE — ED Provider Notes (Signed)
[facility] Emergency Department Provider Note  ____________________________________________  Time seen: Approximately 1:31 AM  I have reviewed the triage vital signs and the nursing notes.   HISTORY  Chief Complaint Flank Pain   HPI Samantha Roy is a 70 y.o. female with a history of IBS, diverticular disease, liver cysts, reflux, peptic ulcer disease who presents for evaluation of abdominal pain.  Patient has had symptoms for 2 days.  She is complaining of a dull throbbing constant pain located in the left flank radiating to the left upper quadrant.  The pain has become more severe this evening which prompted the visit to the emergency room.  Patient reports that the pain was so severe that she was doubled over at home.  She has had nausea but no vomiting.  Has been having normal bowel movements.  Patient denies ever having similar pain.  She denies chest pain or shortness of breath, dysuria or hematuria, history of kidney stones.   PMH IBS, diverticular disease, liver cysts, reflux, peptic ulcer disease  Past Surgical History:  Procedure Laterality Date   CESAREAN SECTION      Prior to Admission medications   Not on File    Allergies Codeine  History reviewed. No pertinent family history.  Social History Social History   Tobacco Use   Smoking status: Never   Smokeless tobacco: Never  Substance Use Topics   Alcohol use: Never   Drug use: Never    Review of Systems  Constitutional: Negative for fever. Eyes: Negative for visual changes. ENT: Negative for sore throat. Neck: No neck pain  Cardiovascular: Negative for chest pain. Respiratory: Negative for shortness of breath. Gastrointestinal: + LUQ abdominal pain and nausea. No vomiting or diarrhea. Genitourinary: Negative for dysuria. + L flank pain Musculoskeletal: Negative for back pain. Skin: Negative for rash. Neurological: Negative for headaches, weakness or numbness. Psych:  No SI or HI  ____________________________________________   PHYSICAL EXAM:  VITAL SIGNS: ED Triage Vitals  Enc Vitals Group     BP 02/22/21 2246 (!) 143/79     Pulse Rate 02/22/21 2246 77     Resp 02/22/21 2246 20     Temp 02/22/21 2246 98.2 F (36.8 C)     Temp Source 02/22/21 2246 Oral     SpO2 02/22/21 2246 96 %     Weight 02/22/21 2247 130 lb (59 kg)     Height 02/22/21 2247 5\' 4"  (1.626 m)     Head Circumference --      Peak Flow --      Pain Score 02/22/21 2246 7     Pain Loc --      Pain Edu? --      Excl. in GC? --     Constitutional: Alert and oriented. Well appearing and in no apparent distress. HEENT:      Head: Normocephalic and atraumatic.         Eyes: Conjunctivae are normal. Sclera is non-icteric.       Mouth/Throat: Mucous membranes are moist.       Neck: Supple with no signs of meningismus. Cardiovascular: Regular rate and rhythm. No murmurs, gallops, or rubs. 2+ symmetrical distal pulses are present in all extremities. No JVD. Respiratory: Normal respiratory effort. Lungs are clear to auscultation bilaterally.  Gastrointestinal: Soft, tender to palpation in the left upper quadrant, and non distended with positive bowel sounds. No rebound or guarding. Genitourinary: No CVA tenderness. Musculoskeletal:  No edema, cyanosis, or erythema of extremities.  Neurologic: Normal speech and language. Face is symmetric. Moving all extremities. No gross focal neurologic deficits are appreciated. Skin: Skin is warm, dry and intact. No rash noted. Psychiatric: Mood and affect are normal. Speech and behavior are normal.  ____________________________________________   LABS (all labs ordered are listed, but only abnormal results are displayed)  Labs Reviewed  URINALYSIS, COMPLETE (UACMP) WITH MICROSCOPIC - Abnormal; Notable for the following components:      Result Value   Color, Urine STRAW (*)    APPearance CLEAR (*)    Specific Gravity, Urine 1.004 (*)    All  other components within normal limits  BASIC METABOLIC PANEL - Abnormal; Notable for the following components:   Glucose, Bld 117 (*)    All other components within normal limits  RESP PANEL BY RT-PCR (FLU A&B, COVID) [id number]  CBC   ____________________________________________  EKG  none  ____________________________________________  RADIOLOGY  I have personally reviewed the images performed during this visit and I agree with the Radiologist's read.   Interpretation by Radiologist:  CT Renal Stone Study  Result Date: 02/22/2021 CLINICAL DATA:  Flank pain, kidney stone suspected. Pt presents to ER c/o left flank pain that started yesterday but has become worse today. Pt denies having kidney stones in past. Pt states pain radiates from left flank into abdomen EXAM: CT ABDOMEN AND PELVIS WITHOUT CONTRAST TECHNIQUE: Multidetector CT imaging of the abdomen and pelvis was performed following the standard protocol without IV contrast. COMPARISON:  None. FINDINGS: Lower chest: No acute abnormality. Hepatobiliary: Multiple simple cysts are seen scattered throughout the liver. The liver is otherwise unremarkable. No intra or extrahepatic biliary ductal dilation. Gallbladder unremarkable. Pancreas: Unremarkable Spleen: A 19 x 19 x 27 mm rim calcified structure is seen within the splenic hilum most likely representing a distal splenic artery aneurysm. Patency is not well assessed on this noncontrast examination. Adrenals/Urinary Tract: Adrenal glands are unremarkable. Kidneys are normal, without renal calculi, focal lesion, or hydronephrosis. Bladder is unremarkable. Stomach/Bowel: There is moderate stool seen throughout the colon. There is moderate sigmoid diverticulosis without superimposed focal inflammatory change identified. The stomach, small bowel, and large bowel are otherwise unremarkable. Appendix normal. No free intraperitoneal gas or fluid. Vascular/Lymphatic: Mild aortoiliac atherosclerotic  calcification. No aortic aneurysm. The left gonadal vein is dilated proximally, but is not well evaluated due to noncontrast technique. No pathologic adenopathy within the abdomen and pelvis. Reproductive: Tubal ligation clips are seen within the adnexa bilaterally. The pelvic organs are otherwise unremarkable. Other: No abdominal wall hernia.  The rectum is unremarkable. Musculoskeletal: No acute bone abnormality. No lytic or blastic bone lesion. IMPRESSION: 27 mm rim calcified structure within the splenic hilum concerning for a distal splenic artery aneurysm. CT arteriography is recommended for further evaluation given the patient's reported left flank pain. No perisplenic fluid or free intraperitoneal fluid at this time to suggest rupture. Moderate sigmoid diverticulosis. Moderate stool throughout the colon without evidence of obstruction. Aortic Atherosclerosis (ICD10-I70.0). Electronically Signed   By: Helyn Numbers MD   On: 02/22/2021 23:30   CT Angio Abd/Pel W and/or Wo Contrast  Result Date: 02/23/2021 CLINICAL DATA:  Left upper quadrant abdominal pain, left flank pain EXAM: CTA ABDOMEN AND PELVIS WITHOUT AND WITH CONTRAST TECHNIQUE: Multidetector CT imaging of the abdomen and pelvis was performed using the standard protocol during bolus administration of intravenous contrast. Multiplanar reconstructed images and MIPs were obtained and reviewed to evaluate the vascular anatomy. CONTRAST:  OMNIPAQUE IOHEXOL 350 MG/ML SOLN COMPARISON:  None. FINDINGS: VASCULAR Aorta: The abdominal aorta is of normal caliber. No aneurysm or dissection. No periaortic inflammatory change. Celiac: Widely patent. Normal anatomic configuration. 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 There is no extravasation identified. No perisplenic fluid or ascites identified to suggest rupture. SMA: Widely patent.  No  aneurysm or dissection. Renals: Single renal arteries are widely and demonstrate normal vascular morphology. No aneurysm or dissection. IMA: Widely patent Inflow: Minimal atherosclerotic calcification. No aneurysm or dissection. Internal iliac arteries are patent bilaterally. Proximal Outflow: Widely patent Veins: Unremarkable. Review of the MIP images confirms the above findings. NON-VASCULAR Lower chest: No acute abnormality. Hepatobiliary: Multiple simple hepatic cysts within the liver. The liver is otherwise unremarkable. Gallbladder unremarkable. No intra or extrahepatic biliary ductal dilation. 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  The spleen is unremarkable. Adrenals/Urinary Tract: Adrenal glands are unremarkable. Kidneys are normal, without renal calculi, focal lesion, or hydronephrosis. Bladder is unremarkable. Stomach/Bowel: Moderate sigmoid diverticulosis. Moderate stool throughout the colon without evidence of obstruction. The stomach, small bowel, and large bowel are otherwise unremarkable. No evidence of obstruction or focal inflammation. Appendix normal. No free intraperitoneal gas or fluid. Lymphatic: No pathologic adenopathy within the abdomen and pelvis. Reproductive: Tubal ligation clips are seen bilaterally. The pelvic organs are otherwise unremarkable. Other: Rectum unremarkable.  No abdominal wall hernia. Musculoskeletal: No acute bone abnormality. No lytic or blastic bone lesions are seen. 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 There is, however, no evidence of rupture identified. Interventional radiology consultation is recommended for further management and possible embolization given the size of the aneurysm and potential referable symptoms. NON-VASCULAR Moderate sigmoid diverticulosis. Moderate stool throughout the colon without evidence of obstruction. Aortic Atherosclerosis (ICD10-I70.0). Electronically Signed    By: Helyn NumbersAshesh  Parikh MD   On: 02/23/2021 01:32     ____________________________________________   PROCEDURES  Procedure(s) performed: yes .1-3 Lead EKG Interpretation  Date/Time: 02/23/2021 1:34 AM Performed by: Nita SickleVeronese, [place], MD Authorized by: Nita SickleVeronese, [place], MD     Interpretation: non-specific     ECG rate assessment: normal     Rhythm: sinus rhythm     Ectopy: none     Conduction: normal      Critical Care performed:  None ____________________________________________   INITIAL IMPRESSION / ASSESSMENT AND PLAN / ED COURSE  70 y.o. female with a history of IBS, diverticular disease, liver cysts, reflux, peptic ulcer disease who presents for evaluation of L flank and LUQ abdominal pain x 2 days.  Patient seems in no distress at this time after receiving a Percocet in triage.  She has tenderness to palpation on the left upper quadrant with no rebound or guarding, abdomen is otherwise nondistended.  CT renal done in triage to rule out kidney stone was concerning for a possible calcified structure within the splenic hilum concerning for an aneurysm.  Radiologist recommended a CT angiogram which was performed and confirms a 23 mm splenic artery aneurysm with no signs of ruptured.  However since patient is symptomatic will admit to the hospitalist service for IR consultation for possible embolization.      _____________________________________________ Please note:  Patient was evaluated in Emergency Department today for the symptoms described in the history of present illness. Patient was evaluated in the context of the global COVID-19 pandemic, which necessitated consideration that the patient might be  at risk for infection with the SARS-CoV-2 virus that causes COVID-19. Institutional protocols and algorithms that pertain to the evaluation of patients at risk for COVID-19 are in a state of rapid change based on information released by regulatory bodies including the CDC and federal  and state organizations. These policies and algorithms were followed during the patient's care in the ED.  Some ED evaluations and interventions may be delayed as a result of limited staffing during the pandemic.   [place] Controlled Substance Database was reviewed by me. ____________________________________________   FINAL CLINICAL IMPRESSION(S) / ED DIAGNOSES   Final diagnoses:  Splenic artery aneurysm (HCC)      NEW MEDICATIONS STARTED DURING THIS VISIT:  ED Discharge Orders     None        Note:  This document was prepared using Dragon voice recognition software and may include unintentional dictation errors.    Don Perking, [place], MD 02/23/21 [phone]

## 2021-02-23 NOTE — Plan of Care (Signed)

## 2021-02-23 NOTE — H&P (Signed)
History and Physical    Samantha Roy MWU:[id number] DOB: Dec 02, 1950 DOA: 02/22/2021  PCP: Pcp, No  Patient coming from: Home.  Chief Complaint: Left flank pain.  HPI: [name] is a 70 y.o. female with history of asthma and GERD has been experiencing left flank pain for the last 2 days.  No associated nausea or vomiting.  Denies any fever chills.  Had 2 episodes of diarrhea nonbloody.  ED Course: In the ER patient had CT renal study which showed features concerning for splenic artery aneurysm and radiologist recommended CT angiogram.  CT angiogram confirms splenic artery aneurysm.  Radiologist recommended IR consult for embolization.  Labs were COVID test is pending.  Review of Systems: As per HPI, rest all negative.   Past Medical History:  Diagnosis Date   Asthma    GERD (gastroesophageal reflux disease)     Past Surgical History:  Procedure Laterality Date   CESAREAN SECTION       reports that she has never smoked. She has never used smokeless tobacco. She reports that she does not drink alcohol and does not use drugs.  Allergies  Allergen Reactions   Bee Venom Anaphylaxis   Codeine Swelling   Latex Rash    Family History  Problem Relation Age of Onset   Aneurysm Neg Hx     Prior to Admission medications   Medication Sig Start Date End Date Taking? Authorizing Provider  albuterol (VENTOLIN HFA) 108 (90 Base) MCG/ACT inhaler Inhale 2 puffs into the lungs every 6 (six) hours as needed.   Yes [provider]  esomeprazole (NEXIUM) 20 MG capsule Take 1 capsule by mouth daily. 12/30/20  Yes [provider]    Physical Exam: Constitutional: Moderately built and nourished. Vitals:   02/22/21 2246 02/22/21 2247 02/23/21 0045  BP: (!) 143/79    Pulse: 77  78  Resp: 20  20  Temp: 98.2 F (36.8 C)    TempSrc: Oral    SpO2: 96%  97%  Weight:  59 kg   Height:  5\' 4"  (1.626 m)    Eyes: Anicteric no pallor. ENMT: No discharge from the ears  eyes nose normal. Neck: No mass felt.  No neck rigidity. Respiratory: No rhonchi or crepitations. Cardiovascular: S1-S2 heard. Abdomen: Left flank tenderness no guarding or rigidity. Musculoskeletal: No edema. Skin: No rash. Neurologic: Alert awake oriented to time place and person.  Moves all extremities. Psychiatric: Appears normal.  Normal affect.   Labs on Admission: I have personally reviewed following labs and imaging studies  CBC: Recent Labs  Lab 02/22/21 2249  WBC 7.9  HGB 12.1  HCT 36.7  MCV 90.6  PLT 266   Basic Metabolic Panel: Recent Labs  Lab 02/22/21 2249  NA 136  K 3.8  CL 104  CO2 26  GLUCOSE 117*  BUN 18  CREATININE 0.99  CALCIUM 9.1   GFR: Estimated Creatinine Clearance: 47 mL/min (by C-G formula based on SCr of 0.99 mg/dL). Liver Function Tests: No results for input(s): AST, ALT, ALKPHOS, BILITOT, PROT, ALBUMIN in the last 168 hours. No results for input(s): LIPASE, AMYLASE in the last 168 hours. No results for input(s): AMMONIA in the last 168 hours. Coagulation Profile: No results for input(s): INR, PROTIME in the last 168 hours. Cardiac Enzymes: No results for input(s): CKTOTAL, CKMB, CKMBINDEX, TROPONINI in the last 168 hours. BNP (last 3 results) No results for input(s): PROBNP in the last 8760 hours. HbA1C: No results for input(s): HGBA1C in the  last 72 hours. CBG: No results for input(s): GLUCAP in the last 168 hours. Lipid Profile: No results for input(s): CHOL, HDL, LDLCALC, TRIG, CHOLHDL, LDLDIRECT in the last 72 hours. Thyroid Function Tests: No results for input(s): TSH, T4TOTAL, FREET4, T3FREE, THYROIDAB in the last 72 hours. Anemia Panel: No results for input(s): VITAMINB12, FOLATE, FERRITIN, TIBC, IRON, RETICCTPCT in the last 72 hours. Urine analysis:    Component Value Date/Time   COLORURINE STRAW (A) 02/22/2021 2249   APPEARANCEUR CLEAR (A) 02/22/2021 2249   LABSPEC 1.004 (L) 02/22/2021 2249   PHURINE 6.0 02/22/2021  2249   GLUCOSEU NEGATIVE 02/22/2021 2249   HGBUR NEGATIVE 02/22/2021 2249   BILIRUBINUR NEGATIVE 02/22/2021 2249   KETONESUR NEGATIVE 02/22/2021 2249   PROTEINUR NEGATIVE 02/22/2021 2249   NITRITE NEGATIVE 02/22/2021 2249   LEUKOCYTESUR NEGATIVE 02/22/2021 2249   Sepsis Labs: @LABRCNTIP (procalcitonin:4,lacticidven:4) )No results found for this or any previous visit (from the past 240 hour(s)).   Radiological Exams on Admission: CT Renal Stone Study  Result Date: 02/22/2021 CLINICAL DATA:  Flank pain, kidney stone suspected. Pt presents to ER c/o left flank pain that started yesterday but has become worse today. Pt denies having kidney stones in past. Pt states pain radiates from left flank into abdomen EXAM: CT ABDOMEN AND PELVIS WITHOUT CONTRAST TECHNIQUE: Multidetector CT imaging of the abdomen and pelvis was performed following the standard protocol without IV contrast. COMPARISON:  None. FINDINGS: Lower chest: No acute abnormality. Hepatobiliary: Multiple simple cysts are seen scattered throughout the liver. The liver is otherwise unremarkable. No intra or extrahepatic biliary ductal dilation. Gallbladder unremarkable. Pancreas: Unremarkable Spleen: A 19 x 19 x 27 mm rim calcified structure is seen within the splenic hilum most likely representing a distal splenic artery aneurysm. Patency is not well assessed on this noncontrast examination. Adrenals/Urinary Tract: Adrenal glands are unremarkable. Kidneys are normal, without renal calculi, focal lesion, or hydronephrosis. Bladder is unremarkable. Stomach/Bowel: There is moderate stool seen throughout the colon. There is moderate sigmoid diverticulosis without superimposed focal inflammatory change identified. The stomach, small bowel, and large bowel are otherwise unremarkable. Appendix normal. No free intraperitoneal gas or fluid. Vascular/Lymphatic: Mild aortoiliac atherosclerotic calcification. No aortic aneurysm. The left gonadal vein is  dilated proximally, but is not well evaluated due to noncontrast technique. No pathologic adenopathy within the abdomen and pelvis. Reproductive: Tubal ligation clips are seen within the adnexa bilaterally. The pelvic organs are otherwise unremarkable. Other: No abdominal wall hernia.  The rectum is unremarkable. Musculoskeletal: No acute bone abnormality. No lytic or blastic bone lesion. IMPRESSION: 27 mm rim calcified structure within the splenic hilum concerning for a distal splenic artery aneurysm. CT arteriography is recommended for further evaluation given the patient's reported left flank pain. No perisplenic fluid or free intraperitoneal fluid at this time to suggest rupture. Moderate sigmoid diverticulosis. Moderate stool throughout the colon without evidence of obstruction. Aortic Atherosclerosis (ICD10-I70.0). Electronically Signed   By: 04/24/2021 MD   On: 02/22/2021 23:30   CT Angio Abd/Pel W and/or Wo Contrast  Result Date: 02/23/2021 CLINICAL DATA:  Left upper quadrant abdominal pain, left flank pain EXAM: CTA ABDOMEN AND PELVIS WITHOUT AND WITH CONTRAST TECHNIQUE: Multidetector CT imaging of the abdomen and pelvis was performed using the standard protocol during bolus administration of intravenous contrast. Multiplanar reconstructed images and MIPs were obtained and reviewed to evaluate the vascular anatomy. CONTRAST:  04/25/2021 OMNIPAQUE IOHEXOL 350 MG/ML SOLN COMPARISON:  None. FINDINGS: VASCULAR Aorta: The abdominal aorta is of normal caliber. No  aneurysm or dissection. No periaortic inflammatory change. Celiac: Widely patent. Normal anatomic configuration. 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 There is no extravasation identified. No perisplenic fluid or ascites identified to suggest rupture. SMA: Widely patent.  No aneurysm or dissection. Renals: Single renal arteries are widely  and demonstrate normal vascular morphology. No aneurysm or dissection. IMA: Widely patent Inflow: Minimal atherosclerotic calcification. No aneurysm or dissection. Internal iliac arteries are patent bilaterally. Proximal Outflow: Widely patent Veins: Unremarkable. Review of the MIP images confirms the above findings. NON-VASCULAR Lower chest: No acute abnormality. Hepatobiliary: Multiple simple hepatic cysts within the liver. The liver is otherwise unremarkable. Gallbladder unremarkable. No intra or extrahepatic biliary ductal dilation. 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  The spleen is unremarkable. Adrenals/Urinary Tract: Adrenal glands are unremarkable. Kidneys are normal, without renal calculi, focal lesion, or hydronephrosis. Bladder is unremarkable. Stomach/Bowel: Moderate sigmoid diverticulosis. Moderate stool throughout the colon without evidence of obstruction. The stomach, small bowel, and large bowel are otherwise unremarkable. No evidence of obstruction or focal inflammation. Appendix normal. No free intraperitoneal gas or fluid. Lymphatic: No pathologic adenopathy within the abdomen and pelvis. Reproductive: Tubal ligation clips are seen bilaterally. The pelvic organs are otherwise unremarkable. Other: Rectum unremarkable.  No abdominal wall hernia. Musculoskeletal: No acute bone abnormality. No lytic or blastic bone lesions are seen. 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 There is, however, no evidence of rupture identified. Interventional radiology consultation is recommended for further management and possible embolization given the size of the aneurysm and potential referable symptoms. NON-VASCULAR Moderate sigmoid diverticulosis. Moderate stool throughout the colon without evidence of obstruction. Aortic Atherosclerosis (ICD10-I70.0). Electronically Signed   By: Helyn Numbers MD   On: 02/23/2021 01:32       Assessment/Plan Principal Problem:   Splenic artery aneurysm [facility]) Active Problems:   GERD (gastroesophageal reflux disease)   Asthma    Abdominal pain with splenic artery aneurysm -radiologist recommended getting IR consult for embolization.  Patient will be kept n.p.o. in anticipation of procedure continue with pain relief medications. History of GERD takes PPI. History of asthma presently not wheezing.  COVID test is pending.   DVT prophylaxis: SCDs.  Avoiding anticoagulation in anticipation of procedure. Code Status: Full code. Family Communication: Discussed with patient. Disposition Plan: Home when stable. Consults called: We will consult interventional radiology. Admission status: Observation.   Eduard Clos MD [facility] Pager [phone].  If 7PM-7AM, please contact night-coverage [email or web address] Password [email or web address]  02/23/2021, 2:07 AM

## 2021-02-24 ENCOUNTER — Telehealth: Payer: Self-pay | Admitting: *Deleted

## 2021-02-24 NOTE — Telephone Encounter (Signed)
Referral faxed to Dr. Will Bonnet in [facility] Interventional Radiology by Dr. Gilmer Mor for Ms. Samantha Roy Ph# [phone] Fax [phone]

## 2021-03-16 ENCOUNTER — Other Ambulatory Visit: Payer: Self-pay | Admitting: Interventional Radiology

## 2021-03-16 DIAGNOSIS — I728 Aneurysm of other specified arteries: Secondary | ICD-10-CM

## 2021-04-21 ENCOUNTER — Ambulatory Visit
Admission: RE | Admit: 2021-04-21 | Discharge: 2021-04-21 | Disposition: A | Discharge status: Home / Self Care | Payer: Medicare Other | Source: Ambulatory Visit | Attending: Interventional Radiology | Admitting: Interventional Radiology

## 2021-04-21 DIAGNOSIS — I728 Aneurysm of other specified arteries: Secondary | ICD-10-CM

## 2021-04-21 HISTORY — PX: IR RADIOLOGIST EVAL & MGMT: IMG5224

## 2021-04-21 NOTE — Progress Notes (Signed)
Chief Complaint: Splenic Artery Aneurysm, incidental   Referring Physician(s): Dr. Maryfrances Bunnell, [facility] PCP, [facility]: Dr. Mosetta Putt GI, [facility]: Dr. Vonita Moss Cardiology, [facility]: Dr. Minerva Areola    History of Present Illness: Samantha Roy is a 70 y.o. female presenting today as a scheduled follow up with VIR clinic, after recent admission to [facility] through the ED with onset of left flank pain and abdominal pain.  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   Hx:   Intensity on prior admission was 10/10.  Associated symptom of decreased appetite and a few episodes of diarrhea leading up to admission.  Prior diagnosis in [place] of IBS, telling me that she had biopsies performed to rule out inflammatory bowel disease, with negative work up.     On her ED admission, CTA followed a routine contrast CT, showing a rim calcified splenic hilar aneurysm ~72mm.  No inflammation or fluid.  No evidence of hemorrhage.    She was observed during the admission and DC'd 02/23/21.   Interval:  Today she joins [place] by telemedicine visit, by herself on the phone.  We confirmed her identity with 2 personal identifiers.   Since her discharge she has done fine, with improving abdominal symptoms.  No severe pain, though she does report an occasional sharp/catching pain in the LUQ.  She does not treat this specifically, and it resolves quickly.   Since admission she has visited her PCP, with new health targets established, including blood pressure control.   She has visited her GI, with new diet that has resulted in better overall satisfaction and decreased GI symptoms.  She has visited her cardiologist, with new health targets established.    We did not repeat imaging on this occasion.   Past Medical History:  Diagnosis Date   Asthma    GERD (gastroesophageal reflux disease)     Past Surgical History:  Procedure Laterality Date   CESAREAN SECTION      Allergies: Bee venom,  Codeine, and Latex  Medications: Prior to Admission medications   Medication Sig Start Date End Date Taking? Authorizing Provider  albuterol (VENTOLIN HFA) 108 (90 Base) MCG/ACT inhaler Inhale 2 puffs into the lungs every 6 (six) hours as needed.    [provider]  cyclobenzaprine (FLEXERIL) 5 MG tablet Take 1 tablet (5 mg total) by mouth 3 (three) times daily as needed for muscle spasms. 02/23/21   Danford, Earl Lites, MD  esomeprazole (NEXIUM) 20 MG capsule Take 1 capsule by mouth daily. 12/30/20   [provider]     Family History  Problem Relation Age of Onset   Aneurysm Neg Hx     Social History   Socioeconomic History   Marital status: Widowed    Spouse name: Not on file   Number of children: Not on file   Years of education: Not on file   Highest education level: Not on file  Occupational History   Not on file  Tobacco Use   Smoking status: Never   Smokeless tobacco: Never  Substance and Sexual Activity   Alcohol use: Never   Drug use: Never   Sexual activity: Not on file  Other Topics Concern   Not on file  Social History Narrative   Not on file   Social Determinants of Health   Financial Resource Strain: Not on file  Food Insecurity: Not on file  Transportation Needs: Not on file  Physical Activity: Not on file  Stress: Not on file  Social Connections: Not on file      Review of Systems  Review of Systems: A 12 point ROS discussed and pertinent positives are indicated in the HPI above.  All other systems are negative.  Physical Exam No direct physical exam was performed (except for noted visual exam findings with Video Visits).    Vital Signs: There were no vitals taken for this visit.  Imaging: No results found.  Labs:  CBC: Recent Labs    02/22/21 2249 02/23/21 0540  WBC 7.9 6.7  HGB 12.1 12.4  HCT 36.7 36.5  PLT 266 256    COAGS: No results for input(s): INR, APTT in the last 8760 hours.  BMP: Recent Labs     02/22/21 2249 02/23/21 0540  NA 136 138  K 3.8 4.1  CL 104 104  CO2 26 30  GLUCOSE 117* 104*  BUN 18 15  CALCIUM 9.1 9.0  CREATININE 0.99 0.82  GFRNONAA >60 >60    LIVER FUNCTION TESTS: No results for input(s): BILITOT, AST, ALT, ALKPHOS, PROT, ALBUMIN in the last 8760 hours.  TUMOR MARKERS: No results for input(s): AFPTM, CEA, CA199, CHROMGRNA in the last 8760 hours.  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   Today we again discussed a few items. First, I did address the fact that it is not really possible to know if it causes any of her vague LUQ abdominal complains, but that she should be alert to seek ED medical care should she have any new sudden sever pain in the LUQ with fainting/hypotension and/or overall illness concerning for rupture.  I did let her know that the chance of rupture of this size SAA is <5%.  I also reinforced to her that while typical CV exercise is safe, I would avoid heavy lifting/power lifting.  We discussed the chance of progression, which I think is low chance given the circumferential calcification, but would only be expected to be ~1% annual.    Updated JVS guidelines generally recommend annual surveillance, with CTA or [place].  I think CT is better given the low risk of radiation for her and the 1:1 comparison.   She understands our plan.   Plan: - 1 year follow up Abd CTA, with office visit.  Should she ever wish to consolidate her care to [facility], I am happy to get her connected to the VIR team. I will leave this entirely up to her.  - Continue current care   Electronically Signed: Gilmer Mor 04/21/2021, 8:31 AM   I spent a total of    15 Minutes in remote  clinical consultation, greater than 50% of which was counseling/coordinating care for incidental SAA, surveillance strategy.    Visit type: Audio only (telephone). Audio (no video) only due to patient's lack  of internet/smartphone capability. Alternative for in-person consultation at [facility], [address], [place], [place]. This visit type was conducted due to national recommendations for restrictions regarding the COVID-19 Pandemic (e.g. social distancing).  This format is felt to be most appropriate for this patient at this time.  All issues noted in this document were discussed and addressed.

## 2021-08-15 ENCOUNTER — Other Ambulatory Visit: Payer: Self-pay | Admitting: Interventional Radiology

## 2021-08-15 DIAGNOSIS — I728 Aneurysm of other specified arteries: Secondary | ICD-10-CM

## 2021-08-18 ENCOUNTER — Other Ambulatory Visit: Payer: Self-pay

## 2021-08-18 ENCOUNTER — Ambulatory Visit
Admission: RE | Admit: 2021-08-18 | Discharge: 2021-08-18 | Disposition: A | Discharge status: Home / Self Care | Payer: Medicare Other | Source: Ambulatory Visit | Attending: Interventional Radiology | Admitting: Interventional Radiology

## 2021-08-18 ENCOUNTER — Encounter: Payer: Self-pay | Admitting: *Deleted

## 2021-08-18 DIAGNOSIS — I728 Aneurysm of other specified arteries: Secondary | ICD-10-CM

## 2021-08-18 HISTORY — PX: IR RADIOLOGIST EVAL & MGMT: IMG5224

## 2021-08-18 NOTE — Progress Notes (Signed)
Chief Complaint: Splenic Artery Aneurysm, incidental  Today has concerns over recommendation for blood thinner initiation   Referring Physician(s): Dr. Loleta Books, [facility] PCP, [facility]: Dr. Roderic Scarce GI, [facility]: Dr. Birdie Hopes Cardiology, [facility]: Dr. Iona Coach     History of Present Illness: Samantha Roy is a 71 y.o. female presenting today as a scheduled appointment, requested by her, to discuss the implications of starting blood thinners in the setting of a visceral aneurysm and Afib.     Prior History:    We met Samantha Roy during admission at [facility], when she was referred for evaluation of abdominal pain in the setting of a splenic artery aneurysm discovered on CTA.     On her ED admission, CTA followed a routine contrast CT, showing a rim calcified splenic hilar aneurysm ~40m.  No inflammation or fluid.  No evidence of hemorrhage.    She was observed during the admission and DC'd 02/23/21.   We saw her in follow up 04/21/2021, and she was doing fine.    We established a plan for conservative management, anticipating a 1 year interval CTA abdomen.     Interval:  Today she joins [place] telemedicine visit, by herself on the phone.  We confirmed her identity with 2 personal identifiers.    Since we last had a visit, she has been given the diagnosis of atrial fibrillation, and her cardiologist has discussed starting anticoagulation. She has not had a stroke, and has had no interval problems.    She gets 2 points on her CHADS-VASC score, for female and her age.   Past Medical History:  Diagnosis Date   Asthma    GERD (gastroesophageal reflux disease)     Past Surgical History:  Procedure Laterality Date   CESAREAN SECTION     IR RADIOLOGIST EVAL & MGMT  04/21/2021    Allergies: Bee venom, Codeine, and Latex  Medications: Prior to Admission medications   Medication Sig Start Date End Date Taking? Authorizing Provider  albuterol (VENTOLIN HFA) 108 (90 Base) MCG/ACT  inhaler Inhale 2 puffs into the lungs every 6 (six) hours as needed.    [provider]  cyclobenzaprine (FLEXERIL) 5 MG tablet Take 1 tablet (5 mg total) by mouth 3 (three) times daily as needed for muscle spasms. 02/23/21   Danford, CSuann Larry MD  esomeprazole (NEXIUM) 20 MG capsule Take 1 capsule by mouth daily. 12/30/20   [provider]     Family History  Problem Relation Age of Onset   Aneurysm Neg Hx     Social History   Socioeconomic History   Marital status: Widowed    Spouse name: Not on file   Number of children: Not on file   Years of education: Not on file   Highest education level: Not on file  Occupational History   Not on file  Tobacco Use   Smoking status: Never   Smokeless tobacco: Never  Substance and Sexual Activity   Alcohol use: Never   Drug use: Never   Sexual activity: Not on file  Other Topics Concern   Not on file  Social History Narrative   Not on file   Social Determinants of Health   Financial Resource Strain: Not on file  Food Insecurity: Not on file  Transportation Needs: Not on file  Physical Activity: Not on file  Stress: Not on file  Social Connections: Not on file    Review of Systems  Review of Systems: A 12 point  ROS discussed and pertinent positives are indicated in the HPI above.  All other systems are negative.  Physical Exam No direct physical exam was performed (except for noted visual exam findings with Video Visits).   Vital Signs: There were no vitals taken for this visit.  Imaging: No results found.  Labs:  CBC: Recent Labs    02/22/21 2249 02/23/21 0540  WBC 7.9 6.7  HGB 12.1 12.4  HCT 36.7 36.5  PLT 266 256    COAGS: No results for input(s): INR, APTT in the last 8760 hours.  BMP: Recent Labs    02/22/21 2249 02/23/21 0540  NA 136 138  K 3.8 4.1  CL 104 104  CO2 26 30  GLUCOSE 117* 104*  BUN 18 15  CALCIUM 9.1 9.0  CREATININE 0.99 0.82  GFRNONAA >60 >60    LIVER  FUNCTION TESTS: No results for input(s): BILITOT, AST, ALT, ALKPHOS, PROT, ALBUMIN in the last 8760 hours.  TUMOR MARKERS: No results for input(s): AFPTM, CEA, CA199, CHROMGRNA in the last 8760 hours.  Assessment and Plan:   Samantha Lodato is a very pleasant 71 yo female with incidentally discovered splenic artery aneurysm, ~59m, and now with recommendation from cardiologist to possibly initiate anti-coagulation for new atrial fibrillation.   She wanted to discuss the implications of AC with the aneurysm. .    I did emphasize to her the reasons that [facility], specifically to decrease the risk of stroke.  Based on her CHADS-VASC, her risk is low-moderate, 2.2%, which is what she has been told, saying that it was about 3% based on her prior conversation.   We reviewed the fact that with a splenic aneurysm of this size, the chance of rupture is low, <5%.  I did let her know that with rupture, there can be significant bleeding, which would be expected to be greater in the setting of a blood thinner.  There are no specific management suggestions for blood thinners based on published guidelines, but blood pressure control and treatment of HTN is important. Thus, which there is no direct risk involved with any AC medication for increased rate of rupture, certainly management would be altered in the setting of rupture.  She understands.   After discussion, we will anticipate a follow up visit in September of this year, with repeat imaging to observe for any change.   Plan: - Follow up Abd CTA, with office visit, ~September of this year.  Should she ever wish to consolidate her care to [facility] I am happy to get her connected to the [place]. I will leave this entirely up to her.  - Continue current care      Electronically Signed: JCorrie Mckusick1/26/2023, 1:53 PM   I spent a total of    15 Minutes in remote  clinical consultation, greater than 50% of which was  counseling/coordinating care for splenic artery aneurysm, observation, management.    Visit type: Audio only (telephone). Audio (no video) only due to patient's lack of internet/smartphone capability. [facility] [address] [place] [place] This visit type was conducted due to national recommendations for restrictions regarding the COVID-19 Pandemic (e.g. social distancing).  This format is felt to be most appropriate for this patient at this time.  All issues noted in this document were discussed and addressed.

## 2021-09-12 ENCOUNTER — Telehealth: Payer: Self-pay

## 2021-09-12 NOTE — Telephone Encounter (Signed)
NOTES SCANNED TO REFERRAL 

## 2021-09-29 NOTE — Progress Notes (Unsigned)
Cardiology Office Note:    Date:  09/29/2021   ID:  Samantha Roy, DOB [birth year], MRN [id number]  PCP:  Elyse Hsu, Roy   [facility] Providers Cardiologist:  None { Click to update primary Roy,subspecialty Roy or APP then REFRESH:1}    Referring Roy: Gilmer Mor, DO   No chief complaint on file. ***  History of Present Illness:    Samantha Roy is a 71 y.o. female with a hx of ***  Past Medical History:  Diagnosis Date   Asthma    GERD (gastroesophageal reflux disease)     Past Surgical History:  Procedure Laterality Date   CESAREAN SECTION     IR RADIOLOGIST EVAL & MGMT  04/21/2021   IR RADIOLOGIST EVAL & MGMT  08/18/2021    Current Medications: No outpatient medications have been marked as taking for the 10/06/21 encounter (Appointment) with Samantha Roy.     Allergies:   Bee venom, Codeine, and Latex   Social History   Socioeconomic History   Marital status: Widowed    Spouse name: Not on file   Number of children: Not on file   Years of education: Not on file   Highest education level: Not on file  Occupational History   Not on file  Tobacco Use   Smoking status: Never   Smokeless tobacco: Never  Substance and Sexual Activity   Alcohol use: Never   Drug use: Never   Sexual activity: Not on file  Other Topics Concern   Not on file  Social History Narrative   Not on file   Social Determinants of Health   Financial Resource Strain: Not on file  Food Insecurity: Not on file  Transportation Needs: Not on file  Physical Activity: Not on file  Stress: Not on file  Social Connections: Not on file     Family History: The patient's ***family history is negative for Aneurysm.  ROS:   Please see the history of present illness.    *** All other systems reviewed and are negative.  EKGs/Labs/Other Studies Reviewed:    The following studies were reviewed today: ***  EKG:  EKG is *** ordered today.  The ekg ordered today  demonstrates ***  Recent Labs: 02/23/2021: BUN 15; Creatinine, Ser 0.82; Hemoglobin 12.4; Platelets 256; Potassium 4.1; Sodium 138  Recent Lipid Panel No results found for: CHOL, TRIG, HDL, CHOLHDL, VLDL, LDLCALC, LDLDIRECT   Risk Assessment/Calculations:   {Does this patient have ATRIAL FIBRILLATION?:[phone]}       Physical Exam:    VS:  There were no vitals taken for this visit.    Wt Readings from Last 3 Encounters:  02/22/21 130 lb (59 kg)     GEN: *** Well nourished, well developed in no acute distress HEENT: Normal NECK: No JVD; No carotid bruits LYMPHATICS: No lymphadenopathy CARDIAC: ***RRR, no murmurs, rubs, gallops RESPIRATORY:  Clear to auscultation without rales, wheezing or rhonchi  ABDOMEN: Soft, non-tender, non-distended MUSCULOSKELETAL:  No edema; No deformity  SKIN: Warm and dry NEUROLOGIC:  Alert and oriented x 3 PSYCHIATRIC:  Normal affect   ASSESSMENT:    No diagnosis found. PLAN:    In order of problems listed above:  ***      {Are you ordering a CV Procedure (e.g. stress test, cath, DCCV, TEE, etc)?   Press F2        :[id number]}    Medication Adjustments/Labs and Tests Ordered: Current medicines are reviewed at length with the patient today.  Concerns regarding medicines are outlined above.  No orders of the defined types were placed in this encounter.  No orders of the defined types were placed in this encounter.   There are no Patient Instructions on file for this visit.   Signed, Samantha Roy  09/29/2021 3:39 PM    [place]

## 2021-10-06 ENCOUNTER — Ambulatory Visit: Payer: Medicare Other

## 2021-10-06 ENCOUNTER — Ambulatory Visit (INDEPENDENT_AMBULATORY_CARE_PROVIDER_SITE_OTHER): Payer: Medicare Other | Admitting: Cardiology

## 2021-10-06 ENCOUNTER — Encounter: Payer: Self-pay | Admitting: Cardiology

## 2021-10-06 ENCOUNTER — Other Ambulatory Visit: Payer: Self-pay

## 2021-10-06 VITALS — BP 100/68 | HR 60 | Ht 64.0 in | Wt 113.8 lb

## 2021-10-06 DIAGNOSIS — I4891 Unspecified atrial fibrillation: Secondary | ICD-10-CM | POA: Diagnosis not present

## 2021-10-06 DIAGNOSIS — I728 Aneurysm of other specified arteries: Secondary | ICD-10-CM | POA: Diagnosis not present

## 2021-10-06 NOTE — Patient Instructions (Signed)
Medication Instructions:  ? ?Your physician recommends that you continue on your current medications as directed. Please refer to the Current Medication list given to you today. ? ?*If you need a refill on your cardiac medications before your next appointment, please call your pharmacy* ? ? ?Testing/Procedures: ? ?ZIO XT- Long Term Monitor Instructions ? ?Your physician has requested you wear a ZIO patch monitor for 7  days.  ?This is a single patch monitor. [employer] supplies one patch monitor per enrollment. Additional ?stickers are not available. Please do not apply patch if you will be having a Nuclear Stress Test,  ?Echocardiogram, Cardiac CT, MRI, or Chest Xray during the period you would be wearing the  ?monitor. The patch cannot be worn during these tests. You cannot remove and re-apply the  ?ZIO XT patch monitor.  ?Your ZIO patch monitor will be mailed 3 day USPS to your address on file. It may take 3-5 days  ?to receive your monitor after you have been enrolled.  ?Once you have received your monitor, please review the enclosed instructions. Your monitor  ?has already been registered assigning a specific monitor serial # to you. ? ?Billing and Patient Assistance Program Information ? ?We have supplied [employer] with any of your insurance information on file for billing purposes. ?[employer] offers a sliding scale Patient Assistance Program for patients that do not have  ?insurance, or whose insurance does not completely cover the cost of the ZIO monitor.  ?You must apply for the Patient Assistance Program to qualify for this discounted rate.  ?To apply, please call [employer] at [phone], select option 4, select option 2, ask to apply for  ?Patient Assistance Program. Meredeth Ide will ask your household income, and how many people  ?are in your household. They will quote your out-of-pocket cost based on that information.  ?[employer] will also be able to set up a 36-month, interest-free payment plan if  needed. ? ?Applying the monitor ?  ?Shave hair from upper left chest.  ?Hold abrader disc by orange tab. Rub abrader in 40 strokes over the upper left chest as  ?indicated in your monitor instructions.  ?Clean area with 4 enclosed alcohol pads. Let dry.  ?Apply patch as indicated in monitor instructions. Patch will be placed under collarbone on left  ?side of chest with arrow pointing upward.  ?Rub patch adhesive wings for 2 minutes. Remove white label marked "1". Remove the white  ?label marked "2". Rub patch adhesive wings for 2 additional minutes.  ?While looking in a mirror, press and release button in center of patch. A small green light will  ?flash 3-4 times. This will be your only indicator that the monitor has been turned on.  ?Do not shower for the first 24 hours. You may shower after the first 24 hours.  ?Press the button if you feel a symptom. You will hear a small click. Record Date, Time and  ?Symptom in the Patient Logbook.  ?When you are ready to remove the patch, follow instructions on the last 2 pages of Patient  ?Logbook. Stick patch monitor onto the last page of Patient Logbook.  ?Place Patient Logbook in the blue and white box. Use locking tab on box and tape box closed  ?securely. The blue and white box has prepaid postage on it. Please place it in the mailbox as  ?soon as possible. Your physician should have your test results approximately 7 days after the  ?monitor has been mailed back to [facility].  ?Call [facility] [employer] Customer Care at [phone] if you have questions regarding  ?your ZIO XT patch monitor. Call them immediately if you see an orange light blinking on your  ?monitor.  ?If your monitor falls off in less than 4 days, contact our Monitor department at [phone].  ?If your monitor becomes loose or falls off after 4 days call [employer] at [phone] for  ?suggestions on securing your monitor ? ? ? ?Follow-Up: ?At [facility], you and your health needs are our  priority.  As part of our continuing mission to provide you with exceptional heart care, we have created designated Provider Care Teams.  These Care Teams include your primary Cardiologist (physician) and Advanced Practice Providers (APPs -  Physician Assistants and Nurse Practitioners) who all work together to provide you with the care you need, when you need it. ? ?We recommend signing up for the patient portal called "MyChart".  Sign up information is provided on this After Visit Summary.  MyChart is used to connect with patients for Virtual Visits (Telemedicine).  Patients are able to view lab/test results, encounter notes, upcoming appointments, etc.  Non-urgent messages can be sent to your provider as well.   ?To learn more about what you can do with MyChart, go to [email or web address].   ? ?Your next appointment:   ?6 month(s) ? ?The format for your next appointment:   ?In Person ? ?Provider:   ?DR. PEMBERTON  ? ?

## 2021-10-06 NOTE — Progress Notes (Unsigned)
Enrolled for [employer] to mail a ZIO XT long term holter monitor to the patients address on file.  

## 2021-10-06 NOTE — Progress Notes (Signed)
?Cardiology Office Note:   ? ?Date:  10/06/2021  ? ?ID:  Samantha CoonsDeborah Roy, DOB 04-27-1951, MRN [id number] ? ?PCP:  Elyse HsuSchmidt, Susan A, MD ?  ?[facility] Providers ?Cardiologist:  None { ? ? ?Referring MD: Gilmer MorWagner, Jaime, DO  ? ? ?History of Present Illness:   ? ?Samantha Roy is a 71 y.o. female with a hx of asthma, GERD and splenic artery aneurysm who was referred by Dr. Loreta AveWagner for further evaluation of afib. ? ?She was previously seen in the ED 02/22/2021 for left flank pain since a few days prior. A CTA showed an incidental rim calcified splenic hilar aneurysm ~22 mm. No inflammation, fluid, or hemorrhage. She was admitted for observation, then [facility]. ? ?Notes from 08/18/2021 visit with Dr. Loreta AveWagner reviewed. There was discussion regarding the recommendation by her cardiologist to start anticoagulation in the setting of new atrial fibrillation.  ? ?Per review of the record, the patient saw [facility] 07/2021 and reported intermittent palpitations. Cardiac monitor at that time showed evidence of Afib however burden of Afib was not reported. TTE 03/2021 with normal LVEF, normal atrial size, trivial MR and TR. She was recommended for DOAC but did not want to start at that time.  ? ?Today, the patient states she feels overall very well. She is very active without anginal symptoms. Has occasional palpitations but they are short in duration and no lightheadedness, dizziness, or syncope.  We discussed the diagnosis of Afib and the [facility] during the visit. ? ?She denies any chest pain, shortness of breath, or peripheral edema. No lightheadedness, headaches, syncope, orthopnea, or PND. ? ? ?Past Medical History:  ?Diagnosis Date  ? A-fib (HCC)   ? Asthma   ? GERD (gastroesophageal reflux disease)   ? 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HCC)   ? ? ?Past Surgical History:  ?Procedure Laterality Date  ? CESAREAN SECTION    ? IR RADIOLOGIST EVAL & MGMT  04/21/2021  ?  IR RADIOLOGIST EVAL & MGMT  08/18/2021  ? ? ?Current Medications: ?Current Meds  ?Medication Sig  ? albuterol (VENTOLIN HFA) 108 (90 Base) MCG/ACT inhaler Inhale 2 puffs into the lungs every 6 (six) hours as needed.  ? esomeprazole (NEXIUM) 20 MG capsule Take 1 capsule by mouth daily.  ? Multiple Vitamin (MULTI-VITAMIN) tablet Take 1 tablet by mouth daily.  ?  ? ?Allergies:   Bee venom, Codeine, and Latex  ? ?Social History  ? ?Socioeconomic History  ? Marital status: Widowed  ?  Spouse name: Not on file  ? Number of children: Not on file  ? Years of education: Not on file  ? Highest education level: Not on file  ?Occupational History  ? Not on file  ?Tobacco Use  ? Smoking status: Never  ? Smokeless tobacco: Never  ?Substance and Sexual Activity  ? Alcohol use: Never  ? Drug use: Never  ? Sexual activity: Not on file  ?Other Topics Concern  ? Not on file  ?Social History Narrative  ? Not on file  ? ?Social Determinants of Health  ? ?Financial Resource Strain: Not on file  ?Food Insecurity: Not on file  ?Transportation Needs: Not on file  ?Physical Activity: Not on file  ?Stress: Not on file  ?Social Connections: Not on file  ?  ? ?Family History: ?The patient's family history is negative for Aneurysm. ? ?ROS:   ?Review of Systems  ?Constitutional:  Negative for chills and diaphoresis.  ?HENT:  Negative  for congestion and hearing loss.   ?Eyes:  Negative for blurred vision and photophobia.  ?Respiratory:  Negative for hemoptysis and shortness of breath.   ?Cardiovascular:  Negative for chest pain, palpitations, orthopnea, claudication, leg swelling and PND.  ?Gastrointestinal:  Negative for nausea and vomiting.  ?Genitourinary:  Negative for dysuria.  ?Musculoskeletal:  Negative for back pain.  ?Neurological:  Negative for seizures and headaches.  ?Endo/Heme/Allergies:  Negative for polydipsia.  ?Psychiatric/Behavioral:  Negative for memory loss and suicidal ideas.   ? ? ?EKGs/Labs/Other Studies Reviewed:   ? ?The  following studies were reviewed today: ? ?Monitor 07/21/2021 - 07/28/2021 ([facility]): ?1)  Technical quality sufficient for interpretation. Diagnostic monitored time was 91% of study duration. 2)  Manually detected events corresponded to sinus mechanism rhythms with and without PVCs. 3)  Automatically detected events corresponded to atrial fibrillation with PVCs. ? ?Holter Monitor 03/29/2021 ([facility]): ?Conclusions: 1)  This 48 hour Holter scan was adequate for interpretation. 2)  The patient was in sinus rhythm, sinus bradycardia, sinus tachycardia and sinus arrhythmia (strip 7).??? 3)  Ventricular ectopic activity consisted of  multifocal PVCs???  ?(strips 5,13) including couplets (strip 15). 4)  Supraventricular ectopic activity consisted of PACs??? (strips 6,14) including atrial pairs (strip 16) and atrial/SVT runs. The longest was 7 beats at 6:53:38 AM, 91 BPM (strip 8). The fastest run was 5  ?beats at 8:55:39 PM (2), 137 BPM (strip 18). 5)  There were no pauses greater than 2.0 seconds noted.??? 6)  Symptoms of " pressure, fast beat, sharp pain, lightheaded, SOB, feels like heart  burn on side, pain, fatigue" correlated with sinus rhythm  ?(strips 9,10,17,23), with PVC (strip 22) sinus tachycardia (strip 11), sinus bradycardia (strip 20). Event button activations correlated with sinus rhythm (strips 12,19,21). ??? I have reviewed the ECG tracings and report, amended them as necessary and  ?agree with the descriptions /interpretations enumerated on this summary page.  ? ?Echo 03/25/2021 ([facility]): ?Interpretation: ? NORMAL LEFT VENTRICULAR SYSTOLIC FUNCTION  ?  NORMAL LA PRESSURES WITH NORMAL DIASTOLIC FUNCTION  ?  NORMAL RIGHT VENTRICULAR SYSTOLIC FUNCTION  ?  VALVULAR REGURGITATION: TRIVIAL AR, TRIVIAL PR, TRIVIAL TR  ?  NO VALVULAR STENOSIS  ?  TRIVIAL PERICARDIAL EFFUSION  ?  DILATED IVC (2.6 cm) AND ABNORMAL RESPIRATORY COLLAPSE.  ?  NO PRIOR STUDY FOR COMPARISON  ?  3D acquisition and reconstructions were performed as  part of this  ?  examination to more accurately quantify the effects of identified  ?  structural abnormalities as part of the exam. (post-processing on an  ?  Independent workstation).   ? ?EKG:  EKG is personally reviewed. ?10/06/2021: Sinus rhythm. Rate 60 bpm.  ? ? ?Recent Labs: ?02/23/2021: BUN 15; Creatinine, Ser 0.82; Hemoglobin 12.4; Platelets 256; Potassium 4.1; Sodium 138  ? ?Recent Lipid Panel ?No results found for: CHOL, TRIG, HDL, CHOLHDL, VLDL, LDLCALC, LDLDIRECT ? ? ?Risk Assessment/Calculations:   ? ?CHA2DS2-VASc Score = 2  ? This indicates a 2.2% annual risk of stroke. ?The patient's score is based upon: ?CHF History: 0 ?HTN History: 0 ?Diabetes History: 0 ?Stroke History: 0 ?Vascular Disease History: 0 ?Age Score: 1 ?Gender Score: 1 ?  ?  ? ?    ? ?Physical Exam:   ? ?VS:  BP 100/68   Pulse 60   Ht 5\' 4"  (1.626 m)   Wt 113 lb 12.8 oz (51.6 kg)   SpO2 98%   BMI 19.53 kg/m?    ? ?Wt Readings from Last  3 Encounters:  ?10/06/21 113 lb 12.8 oz (51.6 kg)  ?02/22/21 130 lb (59 kg)  ?  ? ?GEN: Well nourished, well developed in no acute distress ?HEENT: Normal ?NECK: No JVD; No carotid bruits ?CARDIAC: RRR, no murmurs, rubs, gallops ?RESPIRATORY:  Clear to auscultation without rales, wheezing or rhonchi  ?ABDOMEN: Soft, non-tender, non-distended ?MUSCULOSKELETAL:  No edema; No deformity  ?SKIN: Warm and dry ?NEUROLOGIC:  Alert and oriented x 3 ?PSYCHIATRIC:  Normal affect  ? ?ASSESSMENT:   ? ?1. Atrial fibrillation, unspecified type (HCC)   ?2. Splenic artery aneurysm (HCC)   ? ? ?PLAN:   ? ?In order of problems listed above: ? ?#Afib: ?CHADs-vasc 2. Noted on cardiac monitor performed at [facility] but burden not reported. Patient is hesitant to start anticoagulation and would like to repeat a monitor to reassess burden of Afib. She may also want to consider Watchman in the future as she is very reluctant to start [facility] at this time. ?-Check cardiac monitor ?-Declined starting [facility] pending monitor ?-May be a  watchman candidate in the future pending cardiac monitor ? ?#Splenic Artery Aneurysm: ?Incidentally discovered on CT measuring 59mm.  ?-Followed by vascular surgery ?   ? ?   ?Follow-up:   6 months. ? ?Medication Ad

## 2022-03-17 ENCOUNTER — Other Ambulatory Visit: Payer: Self-pay | Admitting: Interventional Radiology

## 2022-03-17 DIAGNOSIS — I728 Aneurysm of other specified arteries: Secondary | ICD-10-CM

## 2022-04-05 ENCOUNTER — Other Ambulatory Visit: Payer: Self-pay | Admitting: Interventional Radiology

## 2022-04-05 ENCOUNTER — Ambulatory Visit
Admission: RE | Admit: 2022-04-05 | Discharge: 2022-04-05 | Disposition: A | Discharge status: Home / Self Care | Payer: Medicare Other | Source: Ambulatory Visit | Attending: Interventional Radiology | Admitting: Interventional Radiology

## 2022-04-05 DIAGNOSIS — I728 Aneurysm of other specified arteries: Secondary | ICD-10-CM | POA: Diagnosis present

## 2022-04-05 LAB — POCT I-STAT CREATININE: Creatinine, Ser: 0.8 mg/dL (ref 0.44–1.00)

## 2022-04-05 MED ORDER — IOHEXOL 300 MG/ML  SOLN
80.0000 mL | Freq: Once | INTRAMUSCULAR | Status: AC | PRN
  Administered 2022-04-05: 80 mL via INTRAVENOUS

## 2022-04-06 ENCOUNTER — Other Ambulatory Visit: Payer: Self-pay | Admitting: *Deleted

## 2022-04-06 ENCOUNTER — Ambulatory Visit: Payer: Medicare Other | Attending: Cardiology

## 2022-04-06 ENCOUNTER — Telehealth: Payer: Self-pay | Admitting: Cardiology

## 2022-04-06 DIAGNOSIS — I4891 Unspecified atrial fibrillation: Secondary | ICD-10-CM | POA: Insufficient documentation

## 2022-04-06 NOTE — Telephone Encounter (Signed)
Pt would like a callback from nurse regarding Heart Monitor. Pt also has questions regarding CT results. Please advise

## 2022-04-06 NOTE — Progress Notes (Unsigned)
Order redo from 10/07/21 monitor never worn expired.  Patient wished to apply monitor before appointment with Dr. Shari Prows.  Enrolled for [employer] to mail a ZIO XT long term holter monitor to the patients address on file.

## 2022-04-06 NOTE — Telephone Encounter (Signed)
Patient had a 7 day ZIO XT ordered back on 10/07/21 but never wore it.  She wants to know if it would be ok to wear it before her appointment with Dr. Shari Prows. ZIO XT serial # [id number] mailed back on 10/07/21, may be expired.  Please mail the unused monitor back to [facility].  There is no charge for an unused monitor. I will put in a new order and have a replacement monitor sent to your home.  You should receive the device in 2-3 days.

## 2022-04-11 ENCOUNTER — Ambulatory Visit
Admission: RE | Admit: 2022-04-11 | Discharge: 2022-04-11 | Disposition: A | Discharge status: Home / Self Care | Payer: Medicare Other | Source: Ambulatory Visit | Attending: Interventional Radiology | Admitting: Interventional Radiology

## 2022-04-11 DIAGNOSIS — I728 Aneurysm of other specified arteries: Secondary | ICD-10-CM

## 2022-04-11 HISTORY — PX: IR RADIOLOGIST EVAL & MGMT: IMG5224

## 2022-04-11 NOTE — Progress Notes (Signed)
Chief Complaint: Splenic Artery Aneurysm, incidental   Referring Physician(s): Dr. Loleta Books, [facility]: Dr. Johney Frame     History of Present Illness: Samantha Roy is a 71 y.o. female presenting today as a scheduled appointment, for follow up of a splenic artery aneurysm, surveillance.    Prior History:    We met Samantha Roy during admission at [facility], when she was referred for evaluation of abdominal pain in the setting of a splenic artery aneurysm discovered on CTA.     On her ED admission, CTA followed a routine contrast CT, showing a rim calcified splenic hilar aneurysm ~10m.  No inflammation or fluid.  No evidence of hemorrhage.    She was observed during the admission and DC'd 02/23/21.    We saw her in follow up 04/21/2021, and she was doing fine.     We saw her again 08/18/21, and she has since started cardiology care with Dr. PJohney Frame specifically regarding diagnosis of afib.     Interval:  Today she joins [facility] telemedicine visit, by herself on the phone.  We confirmed her identity with 2 personal identifiers.    Since last visit, she describes 1 episode of recurrent pain at the base of her left chest "under her ribs" while surfing.  Otherwise, she has been doing fine.   CTA performed 04/05/22 again shows nothing concerning.  We reviewed the vascular and non-vascular findings today. The splenic aneurysm is estimated about 2.3cm, with no concerning features and essentially unchanged.   We also discussed the incidental findings of the liver cysts, possible small left kidney cyst, and the small pericardial fluid/thickening.   Past Medical History:  Diagnosis Date   [place]    Asthma    GERD (gastroesophageal reflux disease)    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HCC)     Past Surgical History:  Procedure Laterality Date   CESAREAN SECTION     IR RADIOLOGIST EVAL & MGMT  04/21/2021   IR RADIOLOGIST EVAL & MGMT  08/18/2021    Allergies: Bee venom, Codeine, and  Latex  Medications: Prior to Admission medications   Medication Sig Start Date End Date Taking? Authorizing Provider  albuterol (VENTOLIN HFA) 108 (90 Base) MCG/ACT inhaler Inhale 2 puffs into the lungs every 6 (six) hours as needed.    [provider]  esomeprazole (NEXIUM) 20 MG capsule Take 1 capsule by mouth daily. 12/30/20   [provider]  Multiple Vitamin (MULTI-VITAMIN) tablet Take 1 tablet by mouth daily.    [provider]     Family History  Problem Relation Age of Onset   Aneurysm Neg Hx     Social History   Socioeconomic History   Marital status: Widowed    Spouse name: Not on file   Number of children: Not on file   Years of education: Not on file   Highest education level: Not on file  Occupational History   Not on file  Tobacco Use   Smoking status: Never   Smokeless tobacco: Never  Substance and Sexual Activity   Alcohol use: Never   Drug use: Never   Sexual activity: Not on file  Other Topics Concern   Not on file  Social History Narrative   Not on file   Social Determinants of Health   Financial Resource Strain: Not on file  Food Insecurity: Not on file  Transportation Needs: Not on file  Physical Activity: Not on file  Stress: Not on file  Social Connections: Not on  file       Review of Systems  Review of Systems: A 12 point ROS discussed and pertinent positives are indicated in the HPI above.  All other systems are negative.  Advance Care Plan: The advanced care plan/surrogate decision maker was discussed at the time of visit and documented in the medical record.    Physical Exam No direct physical exam was performed (except for noted visual exam findings with Video Visits).    Vital Signs: There were no vitals taken for this visit.  Imaging: CT ANGIO ABDOMEN W &/OR WO CONTRAST  Result Date: 04/05/2022 CLINICAL DATA:  Follow-up of splenic artery aneurysm EXAM: CT ANGIOGRAPHY ABDOMEN TECHNIQUE:  Multidetector CT imaging of the abdomen was performed using the standard protocol during bolus administration of intravenous contrast. Multiplanar reconstructed images and MIPs were obtained and reviewed to evaluate the vascular anatomy. RADIATION DOSE REDUCTION: This exam was performed according to the departmental dose-optimization program which includes automated exposure control, adjustment of the mA and/or kV according to patient size and/or use of iterative reconstruction technique. CONTRAST:  57m OMNIPAQUE IOHEXOL 300 MG/ML  SOLN COMPARISON:  02/23/2021 FINDINGS: VASCULAR Aorta: Aorta is of normal caliber. There are scattered calcifications. There is no dissection or focal aneurysmal dilation. Celiac: There is densely calcified aneurysm in the splenic artery in splenic hilum measuring 2.5 cm in maximum diameter. Part of the lumen of the aneurysm is filled with thrombus. Overall size of the aneurysm has not changed. There is no demonstrable extravasation of contrast. SMA: Unremarkable. Renals: Unremarkable. IMA: Patent. Inflow: Unremarkable. Veins: Unremarkable. Review of the MIP images confirms the above findings. NON-VASCULAR Lower chest: No focal infiltrates are seen in the visualized lower lung fields. Small pericardial effusion is present. Hepatobiliary: There is fatty infiltration. There are a few low-density lesions in liver largest measuring 3 cm suggesting hepatic cysts. There is no dilation of bile ducts. Gallbladder is unremarkable. Pancreas: No focal abnormalities are seen. There is slight prominence of pancreatic duct. Spleen: Spleen is not enlarged. Adrenals/Urinary Tract: There is mild hyperplasia of left adrenal with no significant change. There is no hydronephrosis. There are no renal stones. There is possible subcentimeter cyst in the lower pole of left kidney. Proximal ureters are not dilated. Stomach/Bowel: Stomach is unremarkable. Visualized small bowel loops are unremarkable. Visualized  portions of the appendix is unremarkable. Visualized portions of colon showed no focal abnormalities. Lymphatic: No significant lymphadenopathy is seen. Other: There is no ascites or pneumoperitoneum in the abdomen. Musculoskeletal: No acute findings are seen. IMPRESSION: VASCULAR There is 2.5 cm aneurysm in the splenic artery in the splenic hilum. Dense calcifications are noted in the wall. No interval changes are noted in the size of aneurysm. There is no extravasation of contrast. Other major vascular structures in the abdomen are unremarkable. NON-VASCULAR Hepatic and left renal cysts. Small pericardial effusion. Other findings as described in the body of the report. Electronically Signed   By: PElmer PickerM.D.   On: 04/05/2022 14:33    Labs:  CBC: No results for input(s): "WBC", "HGB", "HCT", "PLT" in the last 8760 hours.  COAGS: No results for input(s): "INR", "APTT" in the last 8760 hours.  BMP: Recent Labs    04/05/22 1321  CREATININE 0.80    LIVER FUNCTION TESTS: No results for input(s): "BILITOT", "AST", "ALT", "ALKPHOS", "PROT", "ALBUMIN" in the last 8760 hours.  TUMOR MARKERS: No results for input(s): "AFPTM", "CEA", "CA199", "CHROMGRNA" in the last 8760 hours.  Assessment and Plan:  Samantha Roy is a very pleasant 71 yo female with incidentally discovered splenic artery aneurysm, just over 2 cm, stable now since baseline CT of 02/23/21.   While she does report occasional ongoing transient episode of lower left chest pain "under her ribs", I do not think related to the splenic aneurysm.   Today we reviewed the incidental findings of the liver cysts and the possible left kidney cyst, as well as trace pericardial fluid/thickening.  I assured her that at this point there is no reason to be concerned, as the most likely things in the liver and kidney would be benign cysts, and we will continue to follow these on annual CTA in at least the short term. If she would like, I  encouraged her to discuss the possible pericardial fluid/thickening with her cardiologist upcoming.  My interpretation of the CT is that there is no change from the baseline CT on 2022.    We will anticipate a follow up visit in about 12 months with repeat CTA abdomen.    She understands that if she has any sudden episode of uncontrollable pain with concern for aneurysm rupture she would need to seek emergency care.    Plan: - Follow up Abd CTA, with office visit, in 1 year.   - Continue current care      Electronically Signed: Corrie Mckusick 04/11/2022, 8:48 AM   I spent a total of    15 Minutes in remote  clinical consultation, greater than 50% of which was counseling/coordinating care for surveillance of splenic artery aneurysm.    Visit type: Audio only (telephone). Audio (no video) only due to patient's lack of internet/smartphone capability. Alternative for in-person consultation at [facility], [address], [place], [place]. This visit type was conducted due to national recommendations for restrictions regarding the COVID-19 Pandemic (e.g. social distancing).  This format is felt to be most appropriate for this patient at this time.  All issues noted in this document were discussed and addressed.

## 2022-04-17 ENCOUNTER — Ambulatory Visit: Payer: Medicare Other | Admitting: Cardiology

## 2022-04-24 DIAGNOSIS — I4891 Unspecified atrial fibrillation: Secondary | ICD-10-CM | POA: Diagnosis not present

## 2022-05-16 NOTE — Progress Notes (Deleted)
Cardiology Office Note:    Date:  05/22/2022   ID:  Samantha Roy, DOB 06/22/1951, MRN [id number]  PCP:  Leana Roe, MD   [facility] Providers Cardiologist:  None {   Referring MD: Leana Roe, MD    History of Present Illness:    Samantha Roy is a 71 y.o. female with a hx of asthma, GERD, Afib and splenic artery aneurysm who presents to clinic for follow-up.   Per review of the record, the patient saw [facility] 07/2021 and reported intermittent palpitations. Cardiac monitor at that time showed evidence of Afib however burden of Afib was not reported. TTE 03/2021 with normal LVEF, normal atrial size, trivial MR and TR. She was recommended for DOAC but did not want to start at that time.   Was seen in follow-up here on 09/2021 where she was feeling overall well. We repeated a zio monitor which showed NSR, 7 beat runs of NSVT, 6 runs of nonsustained SVT, 15.8s run of Atach, rare VE and SVE. No Afib. She had declined AC at that visit and preferred to continue to monitor.  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   Multiple Vitamin (MULTI-VITAMIN) tablet Take 1 tablet by mouth daily.     Allergies:   Bee venom, Codeine, and Latex   Social History   Socioeconomic History   Marital status: Widowed    Spouse name: Not on file   Number of children: Not on file   Years of education: Not on file   Highest education level: Not on file  Occupational History   Not on file  Tobacco  Use   Smoking status: Never   Smokeless tobacco: Never  Substance and Sexual Activity   Alcohol use: Never   Drug use: Never   Sexual activity: Not on file  Other Topics Concern   Not on file  Social History Narrative   Not on file   Social Determinants of Health   Financial Resource Strain: Not on file  Food Insecurity: Not on file  Transportation Needs: Not on file  Physical Activity: Not on file  Stress: Not on file  Social Connections: Not on file     Family History: The patient's family history is negative for Aneurysm.  ROS:   Review of Systems  Constitutional:  Negative for chills and diaphoresis.  HENT:  Negative for congestion and hearing loss.   Eyes:  Negative for blurred vision and photophobia.  Respiratory:  Negative for hemoptysis and shortness of breath.   Cardiovascular:  Negative for chest pain, palpitations, orthopnea, claudication, leg swelling and PND.  Gastrointestinal:  Negative for nausea and vomiting.  Genitourinary:  Negative for dysuria.  Musculoskeletal:  Negative for back pain.  Neurological:  Negative for seizures and headaches.  Endo/Heme/Allergies:  Negative for polydipsia.  Psychiatric/Behavioral:  Negative for memory loss and suicidal ideas.      EKGs/Labs/Other Studies  Reviewed:    The following studies were reviewed today:  Cardiac Monitor 05/10/22:   Patch wear time was 5 days and 6 hours.   Predominant rhythm was NSR with average HR 62bpm   One run of nonsustained VT lasting 7 beats   6 runs of nonsustained SVT, one 15.8s run of atach with variable block   Rare VE (<1%), rare SVE (<1%)   No sustained arrhythmias or significant pauses     Patch Wear Time:  5 days and 6 hours (2023-10-02T09:54:17-0400 to 2023-10-07T16:48:29-0400)   Patient had a min HR of 40 bpm, max HR of 207 bpm, and avg HR of 62 bpm. Predominant underlying rhythm was Sinus Rhythm. 1 run of Ventricular Tachycardia occurred lasting 7 beats with a max rate of  207 bpm (avg 170 bpm). 7 Supraventricular Tachycardia  runs occurred, the run with the fastest interval lasting 15 beats with a max rate of 164 bpm, the longest lasting 15.7 secs with an avg rate of 85 bpm. Some episodes of Supraventricular Tachycardia may be possible Atrial Tachycardia with variable block.  Isolated SVEs were rare (<1.0%), SVE Couplets were rare (<1.0%), and SVE Triplets were rare (<1.0%). Isolated VEs were rare (<1.0%, 1445), VE Couplets were rare (<1.0%, 53), and VE Triplets were rare (<1.0%, 5).  Monitor 07/21/2021 - 07/28/2021 ([facility]): 1)  Technical quality sufficient for interpretation. Diagnostic monitored time was 91% of study duration. 2)  Manually detected events corresponded to sinus mechanism rhythms with and without PVCs. 3)  Automatically detected events corresponded to atrial fibrillation with PVCs.  Holter Monitor 03/29/2021 ([facility]): Conclusions: 1)  This 48 hour Holter scan was adequate for interpretation. 2)  The patient was in sinus rhythm, sinus bradycardia, sinus tachycardia and sinus arrhythmia (strip 7).?< 3)  Ventricular ectopic activity consisted of  multifocal PVCs?<  (strips 5,13) including couplets (strip 15). 4)  Supraventricular ectopic activity consisted of PACs?< (strips 6,14) including atrial pairs (strip 16) and atrial/SVT runs. The longest was 7 beats at 6:53:38 AM, 91 BPM (strip 8). The fastest run was 5  beats at 8:55:39 PM (2), 137 BPM (strip 18). 5)  There were no pauses greater than 2.0 seconds noted.?< 6)  Symptoms of " pressure, fast beat, sharp pain, lightheaded, SOB, feels like heart  burn on side, pain, fatigue" correlated with sinus rhythm  (strips 9,10,17,23), with PVC (strip 22) sinus tachycardia (strip 11), sinus bradycardia (strip 20). Event button activations correlated with sinus rhythm (strips 12,19,21). ?< I have reviewed the ECG tracings and report, amended them as necessary and  agree with the descriptions /interpretations enumerated  on this summary page.   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    NO PRIOR STUDY FOR COMPARISON    3D acquisition and reconstructions were performed as part of this    examination to more accurately quantify the effects of identified    structural abnormalities as part of the exam. (post-processing on an    Independent workstation).    EKG:  EKG is personally reviewed. 10/06/2021: Sinus rhythm. Rate 60 bpm.    Recent Labs: 04/05/2022: Creatinine, Ser 0.80   Recent Lipid Panel No results found for: "CHOL", "TRIG", "  HDL", "CHOLHDL", "VLDL", "LDLCALC", "LDLDIRECT"   Risk Assessment/Calculations:    CHA2DS2-VASc Score = 2   This indicates a 2.2% annual risk of stroke. The patient's score is based upon: CHF History: 0 HTN History: 0 Diabetes History: 0 Stroke History: 0 Vascular Disease History: 0 Age Score: 1 Gender Score: 1           Physical Exam:    VS:  BP (!) 160/90 (BP Location: Left Arm, Patient Position: Supine, Cuff Size: Normal)   Pulse (!) 52   Ht 5\' 4"  (1.626 m)   Wt 115 lb 6.4 oz (52.3 kg)   SpO2 98%   BMI 19.81 kg/m     Wt Readings from Last 3 Encounters:  05/22/22 115 lb 6.4 oz (52.3 kg)  10/06/21 113 lb 12.8 oz (51.6 kg)  02/22/21 130 lb (59 kg)     GEN: Well nourished, well developed in no acute distress HEENT: Normal NECK: No JVD; No carotid bruits CARDIAC: RRR, no murmurs, rubs, gallops RESPIRATORY:  Clear to auscultation without rales, wheezing or rhonchi  ABDOMEN: Soft, non-tender, non-distended MUSCULOSKELETAL:  No edema; No deformity  SKIN: Warm and dry NEUROLOGIC:  Alert and oriented x 3 PSYCHIATRIC:  Normal affect    ASSESSMENT:    No diagnosis found.   PLAN:    In order of problems listed above:  #Afib: CHADs-vasc 2. Noted on cardiac monitor performed at [facility] but burden not reported. Repeat monitor here with no Afib. Patient is hesitant to start anticoagulation and would like to repeat a monitor to reassess burden of Afib. Discussed option of loop*** -Check cardiac monitor -Declined starting AC pending monitor -May be a watchman candidate in the future pending cardiac monitor  #Splenic Artery Aneurysm: Incidentally discovered on CT measuring 36mm.  -Followed by vascular surgery        Follow-up:   6 months.  Medication Adjustments/Labs and Tests Ordered: Current medicines are reviewed at length with the patient today.  Concerns regarding medicines are outlined above.   No orders of the defined types were placed in this encounter.  No orders of the defined types were placed in this encounter.  There are no Patient Instructions on file for this visit.     Signed, 38m, MD  05/22/2022 9:36 AM    [facility]

## 2022-05-22 ENCOUNTER — Ambulatory Visit: Payer: Medicare Other | Attending: Cardiology | Admitting: Cardiology

## 2022-05-22 ENCOUNTER — Encounter: Payer: Self-pay | Admitting: Cardiology

## 2022-05-22 VITALS — BP 160/90 | HR 52 | Ht 64.0 in | Wt 115.4 lb

## 2022-05-22 DIAGNOSIS — I3139 Other pericardial effusion (noninflammatory): Secondary | ICD-10-CM

## 2022-05-22 DIAGNOSIS — R42 Dizziness and giddiness: Secondary | ICD-10-CM | POA: Diagnosis not present

## 2022-05-22 DIAGNOSIS — I480 Paroxysmal atrial fibrillation: Secondary | ICD-10-CM | POA: Diagnosis present

## 2022-05-22 DIAGNOSIS — I728 Aneurysm of other specified arteries: Secondary | ICD-10-CM

## 2022-05-22 NOTE — Patient Instructions (Signed)
Medication Instructions:   Your physician recommends that you continue on your current medications as directed. Please refer to the Current Medication list given to you today.  *If you need a refill on your cardiac medications before your next appointment, please call your pharmacy*   You have been referred to SEE DR. CAMNITZ ELECTROPHYSIOLOGIST HERE IN OUR OFFICE FOR CONSIDERATION OF AFIB ABLATION    Testing/Procedures:  Your physician has requested that you have an echocardiogram. Echocardiography is a painless test that uses sound waves to create images of your heart. It provides your doctor with information about the size and shape of your heart and how well your heart's chambers and valves are working. This procedure takes approximately one hour. There are no restrictions for this procedure. Please do NOT wear cologne, perfume, aftershave, or lotions (deodorant is allowed). Please arrive 15 minutes prior to your appointment time.    Follow-Up:  3 MONTHS IN THE OFFICE WITH AN EXTENDER   Important Information About Sugar

## 2022-05-22 NOTE — Progress Notes (Signed)
Cardiology Office Note:    Date:  05/22/2022   ID:  Samantha Roy, DOB 01/06/1951, MRN [id number]  PCP:  Samantha Hsu, MD   [facility] Providers Cardiologist:  None {  Referring MD: Samantha Hsu, MD    History of Present Illness:    Samantha Roy is a 71 y.o. female with a hx of asthma, GERD, Afib and splenic artery aneurysm who presents to clinic for follow-up.   Per review of the record, the patient saw [facility] 07/2021 and reported intermittent palpitations. Cardiac monitor at that time showed evidence of Afib however burden of Afib was not reported. TTE 03/2021 with normal LVEF, normal atrial size, trivial MR and TR. She was recommended for DOAC but did not want to start at that time.   Was seen in follow-up here on 09/2021 where she was feeling overall well. We repeated a zio monitor which showed NSR, 7 beat runs of NSVT, 6 runs of nonsustained SVT, 15.8s run of Atach, rare VE and SVE. No Afib. She had declined AC at that visit and preferred to continue to monitor.  Today, the patient states that she is currently feeling a little dizzy. She has noticed that this usually correlates with slower heart rates. During today's visit her dizziness worsened and she began feeling presyncopal. She was initially placed supine on the exam table. Recheck of blood pressure was 160/90 during this episode (was 136/64 on arrival). Of note, she has not yet eaten this morning due to a 1 hour drive, so she was provided with ginger ale and crackers. She requested to sit up after a short time and we resumed our visit without further events. Her dizziness improved and she no longer felt presyncopal.  Her main complaint today is intermittent episodes of palpitations that feel like "the whole heart is going to beat out of your chest." Aside from the feeling of her pounding palpitations, she has no other chest discomfort. These episodes have a very sudden onset, and may occur while she is  hiking on a trail or walking on a treadmill. Sometimes she feels like she may pass out so she is forced to lie down. Her symptoms may last for random times, shorter or longer than 20 minutes. Afterwards she is able to stand up and continue with her hiking. On one day she felt like she had 3 episodes. Notably, her apple watch notified she was in Afib at that time. When she was wearing the zio monitor, she did not have any of these episodes but her Afib was captured on prior monitor from [place].  Due to history of splenic aneurysm, she is very hesitant to start apixaban. She would like [place] to clear with Dr. Loreta Roy.  She denies any peripheral edema, headaches, orthopnea, or PND.   Also, she endorses a history of IBS, diverticulitis, and other undiagnosed GI issues. She is highly allergic to many spices and foods which have exacerbated her GI issues. She is concerned about starting new meds due to known intolerances.   Past Medical History:  Diagnosis Date   A-fib (H[facility])    Asthma    GERD (gastroesophageal reflux disease)    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[facility])     Past Surgical History:  Procedure Laterality Date   CESAREAN SECTION     IR RADIOLOGIST EVAL & MGMT  04/21/2021   IR RADIOLOGIST EVAL & MGMT  08/18/2021   IR RADIOLOGIST EVAL & MGMT  04/11/2022    Current Medications: Current Meds  Medication Sig   albuterol (VENTOLIN HFA) 108 (90 Base) MCG/ACT inhaler Inhale 2 puffs into the lungs every 6 (six) hours as needed.   Multiple Vitamin (MULTI-VITAMIN) tablet Take 1 tablet by mouth daily.     Allergies:   Bee venom, Codeine, and Latex   Social History   Socioeconomic History   Marital status: Widowed    Spouse name: Not on file   Number of children: Not on file   Years of education: Not on file   Highest education level: Not on file  Occupational History   Not on file  Tobacco Use   Smoking status: Never   Smokeless tobacco: Never  Substance and Sexual Activity   Alcohol use: Never    Drug use: Never   Sexual activity: Not on file  Other Topics Concern   Not on file  Social History Narrative   Not on file   Social Determinants of Health   Financial Resource Strain: Not on file  Food Insecurity: Not on file  Transportation Needs: Not on file  Physical Activity: Not on file  Stress: Not on file  Social Connections: Not on file     Family History: The patient's family history is negative for Aneurysm.  ROS:   Review of Systems  Constitutional:  Negative for chills and diaphoresis.  HENT:  Negative for congestion and hearing loss.   Eyes:  Negative for blurred vision and photophobia.  Respiratory:  Negative for hemoptysis and shortness of breath.   Cardiovascular:  Positive for palpitations. Negative for chest pain, orthopnea, claudication, leg swelling and PND.  Gastrointestinal:  Positive for abdominal pain (Left). Negative for nausea and vomiting.  Genitourinary:  Negative for dysuria.  Musculoskeletal:  Negative for back pain.  Neurological:  Positive for dizziness. Negative for seizures and headaches.  Endo/Heme/Allergies:  Negative for polydipsia.  Psychiatric/Behavioral:  Negative for memory loss and suicidal ideas.      EKGs/Labs/Other Studies Reviewed:    The following studies were reviewed today:  Cardiac Monitor 05/10/22:   Patch wear time was 5 days and 6 hours.   Predominant rhythm was NSR with average HR 62bpm   One run of nonsustained VT lasting 7 beats   6 runs of nonsustained SVT, one 15.8s run of atach with variable block   Rare VE (<1%), rare SVE (<1%)   No sustained arrhythmias or significant pauses   Patch Wear Time:  5 days and 6 hours (2023-10-02T09:54:17-0400 to 2023-10-07T16:48:29-0400)   Patient had a min HR of 40 bpm, max HR of 207 bpm, and avg HR of 62 bpm. Predominant underlying rhythm was Sinus Rhythm. 1 run of Ventricular Tachycardia occurred lasting 7 beats with a max rate of 207 bpm (avg 170 bpm). 7 Supraventricular  Tachycardia  runs occurred, the run with the fastest interval lasting 15 beats with a max rate of 164 bpm, the longest lasting 15.7 secs with an avg rate of 85 bpm. Some episodes of Supraventricular Tachycardia may be possible Atrial Tachycardia with variable block.  Isolated SVEs were rare (<1.0%), SVE Couplets were rare (<1.0%), and SVE Triplets were rare (<1.0%). Isolated VEs were rare (<1.0%, 1445), VE Couplets were rare (<1.0%, 53), and VE Triplets were rare (<1.0%, 5).  CTA Abdomen  04/05/2022: IMPRESSION: VASCULAR   There is 2.5 cm aneurysm in the splenic artery in the splenic hilum. Dense calcifications are noted in the wall. No interval changes are noted in the size of aneurysm. There is no extravasation of contrast. Other major  vascular structures in the abdomen are unremarkable.   NON-VASCULAR   Hepatic and left renal cysts. Small pericardial effusion. Other findings as described in the body of the report.  Monitor 07/21/2021 - 07/28/2021 ([facility]): 1)  Technical quality sufficient for interpretation. Diagnostic monitored time was 91% of study duration. 2)  Manually detected events corresponded to sinus mechanism rhythms with and without PVCs. 3)  Automatically detected events corresponded to atrial fibrillation with PVCs.  Holter Monitor 03/29/2021 ([facility]): Conclusions: 1)  This 48 hour Holter scan was adequate for interpretation. 2)  The patient was in sinus rhythm, sinus bradycardia, sinus tachycardia and sinus arrhythmia (strip 7).?< 3)  Ventricular ectopic activity consisted of  multifocal PVCs?<  (strips 5,13) including couplets (strip 15). 4)  Supraventricular ectopic activity consisted of PACs?< (strips 6,14) including atrial pairs (strip 16) and atrial/SVT runs. The longest was 7 beats at 6:53:38 AM, 91 BPM (strip 8). The fastest run was 5  beats at 8:55:39 PM (2), 137 BPM (strip 18). 5)  There were no pauses greater than 2.0 seconds noted.?< 6)  Symptoms of " pressure, fast  beat, sharp pain, lightheaded, SOB, feels like heart  burn on side, pain, fatigue" correlated with sinus rhythm  (strips 9,10,17,23), with PVC (strip 22) sinus tachycardia (strip 11), sinus bradycardia (strip 20). Event button activations correlated with sinus rhythm (strips 12,19,21). ?< I have reviewed the ECG tracings and report, amended them as necessary and  agree with the descriptions /interpretations enumerated on this summary page.   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    NO PRIOR STUDY FOR COMPARISON    3D acquisition and reconstructions were performed as part of this    examination to more accurately quantify the effects of identified    structural abnormalities as part of the exam. (post-processing on an    Independent workstation).    EKG:  EKG is personally reviewed. 05/22/2022:  Sinus bradycardia. Rate 50 bpm. 10/06/2021: Sinus rhythm. Rate 60 bpm.   Recent Labs: 04/05/2022: Creatinine, Ser 0.80   Recent Lipid Panel No results found for: "CHOL", "TRIG", "HDL", "CHOLHDL", "VLDL", "LDLCALC", "LDLDIRECT"   Risk Assessment/Calculations:    CHA2DS2-VASc Score = 2   This indicates a 2.2% annual risk of stroke. The patient's score is based upon: CHF History: 0 HTN History: 0 Diabetes History: 0 Stroke History: 0 Vascular Disease History: 0 Age Score: 1 Gender Score: 1    Physical Exam:    VS:  BP (!) 160/90 (BP Location: Left Arm, Patient Position: Supine, Cuff Size: Normal)   Pulse (!) 52   Ht 5\' 4"  (1.626 m)   Wt 115 lb 6.4 oz (52.3 kg)   SpO2 98%   BMI 19.81 kg/m     Wt Readings from Last 3 Encounters:  05/22/22 115 lb 6.4 oz (52.3 kg)   10/06/21 113 lb 12.8 oz (51.6 kg)  02/22/21 130 lb (59 kg)     GEN: Well nourished, well developed in no acute distress HEENT: Normal NECK: No JVD; No carotid bruits CARDIAC: RRR, no murmurs, rubs, gallops RESPIRATORY:  Clear to auscultation without rales, wheezing or rhonchi  ABDOMEN: Soft, non-tender, non-distended MUSCULOSKELETAL:  No edema;  No deformity  SKIN: Warm and dry NEUROLOGIC:  Alert and oriented x 3 PSYCHIATRIC:  Normal affect   ASSESSMENT:    1. Paroxysmal atrial fibrillation (HCC)   2. Pericardial effusion   3. Splenic artery aneurysm (HCC)   4. Dizziness      PLAN:    In order of problems listed above:  #Afib: CHADs-vasc 2. Noted on cardiac monitor performed at [facility] but burden not reported. Repeat monitor here with no Afib, but did not have her palpitation episodes while wearing the monitor. Her apple watch did detect Afib, however. She is hesitant to start apixaban due to splenic aneurysm but would be willing to start if cleared by Dr. Loreta Roy. Cannot tolerate BB due to bradycardia. She is a good candidate for front-line ablation and she is very interested in pursuing this at this time. She also does not want to be on long-term AC if possible. -Will discuss with Dr. Loreta Roy about starting apixaban -Good candidate for ablation if Dr. Loreta Roy clears for [facility] -Will refer to EP -Cannot tolerate nodal agents due to baseline bradycardia  #Splenic Artery Aneurysm: Incidentally discovered on CT measuring 24mm.  -Followed by vascular surgery  #Dizziness: Had episode of dizziness in the office. BP was elevated at that time and when examined, HR had increased to the 70s. Suspect it was related to not eating this morning. Symptoms resolved with ginger ale and crackers. Will monitor going forward. She is not on nodal agents or BP lowering agents.       Follow-up:   3 months.  Medication Adjustments/Labs and Tests Ordered: Current medicines are reviewed at length with  the patient today.  Concerns regarding medicines are outlined above.   Orders Placed This Encounter  Procedures   Ambulatory referral to Cardiac Electrophysiology   EKG 12-Lead   ECHOCARDIOGRAM COMPLETE   No orders of the defined types were placed in this encounter.  Patient Instructions  Medication Instructions:   Your physician recommends that you continue on your current medications as directed. Please refer to the Current Medication list given to you today.  *If you need a refill on your cardiac medications before your next appointment, please call your pharmacy*   You have been referred to SEE DR. CAMNITZ ELECTROPHYSIOLOGIST HERE IN OUR OFFICE FOR CONSIDERATION OF AFIB ABLATION    Testing/Procedures:  Your physician has requested that you have an echocardiogram. Echocardiography is a painless test that uses sound waves to create images of your heart. It provides your doctor with information about the size and shape of your heart and how well your heart's chambers and valves are working. This procedure takes approximately one hour. There are no restrictions for this procedure. Please do NOT wear cologne, perfume, aftershave, or lotions (deodorant is allowed). Please arrive 15 minutes prior to your appointment time.    Follow-Up:  3 MONTHS IN THE OFFICE WITH AN EXTENDER   Important Information About Sugar         I,Mathew Stumpf,acting as a scribe for [name], MD.,have documented all relevant documentation on the behalf of [name], MD,as directed by  [name], MD while in the presence of [name], MD.   I, [name], MD, have reviewed all documentation for this visit. The documentation on 05/22/22 for the exam, diagnosis, procedures, and orders are all accurate and complete.   Signed, [name], MD  05/22/2022 10:07 AM    [facility]

## 2022-05-23 ENCOUNTER — Telehealth: Payer: Self-pay | Admitting: *Deleted

## 2022-05-23 NOTE — Telephone Encounter (Signed)
-----   Message from Selinda Orion sent at 05/23/2022  4:32 PM EDT ----- Regarding: RE: REFER TO EP DR. CAMNITZ PER DR. Kerin Perna, I scheduled the pt on 07/08/22 at 3:15 with Dr. Myles Gip. I also added her on the waitlist and will call her back if we get a sooner appt available. :)   ----- Message ----- From: Nuala Alpha, LPN Sent: [id number]   9:53 AM EDT To: Nuala Alpha, LPN; Angeline S Hammer; # Subject: REFER TO EP DR. CAMNITZ PER DR. Johney Frame      Dr. Johney Frame just saw this pt in clinic and referred her to EP to see Dr. Curt Bears for consideration of afib ablation.  Referral is in.  Can you please call her to arrange?  Shoot me the date thereafter?   Thanks ladies, Karlene Einstein

## 2022-05-24 ENCOUNTER — Ambulatory Visit (HOSPITAL_COMMUNITY): Payer: Medicare Other | Attending: Cardiology

## 2022-05-24 DIAGNOSIS — I3139 Other pericardial effusion (noninflammatory): Secondary | ICD-10-CM | POA: Diagnosis present

## 2022-05-24 DIAGNOSIS — I480 Paroxysmal atrial fibrillation: Secondary | ICD-10-CM | POA: Diagnosis present

## 2022-05-24 LAB — ECHOCARDIOGRAM COMPLETE
Area-P 1/2: 3.21 cm2
S' Lateral: 2.8 cm

## 2022-05-31 ENCOUNTER — Telehealth: Payer: Self-pay | Admitting: *Deleted

## 2022-05-31 ENCOUNTER — Ambulatory Visit (HOSPITAL_COMMUNITY): Payer: Medicare Other

## 2022-05-31 MED ORDER — APIXABAN 5 MG PO TABS: 5.0000 mg | ORAL_TABLET | Freq: Two times a day (BID) | ORAL | 11 refills | Status: DC

## 2022-05-31 NOTE — Telephone Encounter (Signed)
-----   Message from [name], MD sent at 05/31/2022  1:18 PM EST ----- Regarding: RE:  Thank you so much for getting back to me.  We will start her on apixaban 5mg  BID.   Appreciate your help! -Heather ----- Message ----- From: , DO Sent: 05/31/2022   9:33 AM EST To: 13/02/2022, MD Subject: RE:                                            Good morning, and sorry for delay.  Yes, I think starting [facility] for her is fine.  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    Thank you and have a great day. Warmly, [facility]  ----- Message ----- From: Gilmer Mor, MD Sent: 05/25/2022  11:32 AM EST To: 13/08/2021, DO  Hi Dr. Gilmer Mor,  I saw Ms. Sarr in clinic. She is having more frequent episodes of Afib. She is willing to start anticoagulation, but she would like Loreta Ave to clear this with you before we start.   Thank you so much!  Sincerely, [place]
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
## 2022-06-01 NOTE — Telephone Encounter (Signed)
**Note De-Identified Verlan Grotz Obfuscation** To save time for the pt who lives 1 and  hours away from the office, I completed this providers page of a [employer] application and e-mailed it to Dr Devin Going nurse so she can obtain her signnature date it and to add to the application that was left in the front office for he pt to pick up. I add a note written to the pt explaining that she can mail her entire application to [employer] once she gets her part of it ready.

## 2022-06-01 NOTE — Telephone Encounter (Signed)
Pt assistance forms for eliquis was signed and dated on the MD page by Dr. Shari Prows.   Forms placed upfront in the pts sample bag to pick up.  Pt aware of forms and to complete them and get them back to our office.   Pt agreed to plan.

## 2022-06-01 NOTE — Telephone Encounter (Signed)
5 mg eliquis samples left at the front desk for the pt to pick up this afternoon.  Copay card included.

## 2022-06-05 ENCOUNTER — Telehealth: Payer: Self-pay | Admitting: Cardiology

## 2022-06-05 NOTE — Telephone Encounter (Signed)
Pt called back regarding the concerns stated with Eliquis below.    Pt states that when she takes her Eliquis, throughout the day she feels foggy, dizzy, feels weak, and has been getting headaches. As she nears the time to take a second dose, she feels better / more clear minded?  Pt believes the Eliquis is making her feel this way.      Pt also c/o stomach pains and cramping, beginning under left rib cage, radiating towards the right side of her abdomen.  These symptoms are intermittent throughout the day.  With GI bleed symptoms present, Pt asked about her stools, black tarry vs bright red blood present to r/o GI bleed?  Pt stated NO signs of blood in her stool.      Pt had some questions regarding the Eliquis forms, and wanted to speak with Lajoyce Corners, Dr. Devin Going nurse.  Pt would not go into great details with me.    Pt wants a follow up call from nurse to address her Eliquis concerns, and wants to know what Dr. Shari Prows recommends.    Follow up required.

## 2022-06-05 NOTE — Telephone Encounter (Signed)
Pt c/o medication issue:  1. Name of Medication:   apixaban (ELIQUIS) 5 MG TABS tablet   2. How are you currently taking this medication (dosage and times per day)?  As prescribed  3. Are you having a reaction (difficulty breathing--STAT)?   No  4. What is your medication issue?   Patient stated she gets a foggy head, fatigued and dizziness with this medication.  Patient stated she feels like she does not have a clear mind.

## 2022-06-06 ENCOUNTER — Other Ambulatory Visit (HOSPITAL_COMMUNITY): Payer: Medicare Other

## 2022-06-06 NOTE — Telephone Encounter (Signed)
Left the pt a message to call the office back to discuss recommendations per Dr. Shari Prows and PharmD.  Per Dr. Shari Prows and Malena Peer [facility], we could offer to switch her to Xarelto 20 mg po daily at supper, if she is interested.    Eliquis forms will be addressed by PA Nurse, who has been working with the pt on this.   Will cc PA Nurse in on this message, to touch base with the pt tomorrow, about eliquis pt assistance forms.   Left her a message to call the office back, to further discuss and offer her a trial of Xarelto 20 mg, if she feels like Eliquis is not a good fit for her.  Per Pharmacist, her side effects she mentioned while starting Eliquis in the past few days, are very unusual.

## 2022-06-06 NOTE — Telephone Encounter (Signed)
Discussed with Dr. Shari Prows, unusual side effects. Would offer trial of Xarelto instead.

## 2022-06-06 NOTE — Telephone Encounter (Signed)
Melissa, do you have any insight or recommendations Dr. Shari Prows and I can give this pt about new start eliquis and pt complaining of stomach pain, cramping, feeling foggy, dizzy, and weakness within days of starting this med.   She is new start for new onset afib.   Any advisement?  Thanks!

## 2022-06-07 NOTE — Telephone Encounter (Signed)
Patient returned call

## 2022-06-07 NOTE — Telephone Encounter (Signed)
Left a message for the pt to call the office back.  Pt will be seeing Dr. Nelly Laurence in EP tomorrow 11/16, for further discussion and consideration of afib ablation.  If pt doesn't return a call back before that appt, she can further discuss alternative anticoagulation therapy with Dr. Nelly Laurence at that time.   Will route this message to Dr. Morrie Sheldon RN to assist with this, when seeing the pt in their clinic tomorrow.

## 2022-06-08 ENCOUNTER — Ambulatory Visit: Payer: Medicare Other | Attending: Cardiovascular Disease | Admitting: Cardiovascular Disease

## 2022-06-08 ENCOUNTER — Encounter: Payer: Self-pay | Admitting: Cardiovascular Disease

## 2022-06-08 VITALS — BP 122/64 | HR 76 | Ht 64.0 in | Wt 114.8 lb

## 2022-06-08 DIAGNOSIS — I480 Paroxysmal atrial fibrillation: Secondary | ICD-10-CM

## 2022-06-08 NOTE — Patient Instructions (Signed)
Medication Instructions:  Your physician recommends that you continue on your current medications as directed. Please refer to the Current Medication list given to you today.  *If you need a refill on your cardiac medications before your next appointment, please call your pharmacy*   Lab Work: BMET and CBC If you have labs (blood work) drawn today and your tests are completely normal, you will receive your results only by: MyChart Message (if you have MyChart) OR A paper copy in the mail If you have any lab test that is abnormal or we need to change your treatment, we will call you to review the results.   Testing/Procedures: Your physician has requested that you have cardiac CT. Cardiac computed tomography (CT) is a painless test that uses an x-ray machine to take clear, detailed pictures of your heart. For further information please visit www.cardiosmart.org. Please follow instruction sheet as given.  Your physician has recommended that you have an ablation. Catheter ablation is a medical procedure used to treat some cardiac arrhythmias (irregular heartbeats). During catheter ablation, a long, thin, flexible tube is put into a blood vessel in your groin (upper thigh), or neck. This tube is called an ablation catheter. It is then guided to your heart through the blood vessel. Radio frequency waves destroy small areas of heart tissue where abnormal heartbeats may cause an arrhythmia to start. Please see the instruction sheet given to you today.  Follow-Up: At [facility], you and your health needs are our priority.  As part of our continuing mission to provide you with exceptional heart care, we have created designated Provider Care Teams.  These Care Teams include your primary Cardiologist (physician) and Advanced Practice Providers (APPs -  Physician Assistants and Nurse Practitioners) who all work together to provide you with the care you need, when you need it.  Your next  appointment:   See instruction letter  Important Information About Sugar       

## 2022-06-08 NOTE — Progress Notes (Signed)
Electrophysiology Office Note:    Date:  06/08/2022   ID:  Samantha Roy, DOB 17-Aug-1950, MRN [id number]  PCP:  Elyse Hsu, MD   [facility] Providers Cardiologist:  None Electrophysiologist:  Maurice Small, MD     Referring MD: [name], MD   Chief complaint: palpitations  History of Present Illness:    Samantha Roy is a 71 y.o. female with a hx of Afib, splenic artery aneurysm, asthma, GERD referred for AF management.  She was evaluated for palpitations by [facility] cardiology in January 2023. A cardiac monitor was placed and detected atrial fibrillation. She was seen at [facility] in March. ZioPatch at that time showed breif NSVT and nonsustained SVT but no AF.   She has continued to have intermittent palpitations. She is markedly symptomatic with episodes. She sometimes has to lie down to avoid passing out. She is not able to function at all during episodes. She has captured episodes of AF on her apple watch.  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     Allergies:   Bee venom, Codeine, and Latex   Social History   Socioeconomic History   Marital status: Widowed    Spouse name: Not on file   Number of children: Not on file   Years of education: Not on file   Highest education level: Not on file  Occupational History   Not on file  Tobacco Use   Smoking status: Never   Smokeless tobacco: Never  Substance and Sexual Activity   Alcohol use: Never   Drug use: Never   Sexual activity: Not on file  Other  Topics Concern   Not on file  Social History Narrative   Not on file   Social Determinants of Health   Financial Resource Strain: Not on file  Food Insecurity: Not on file  Transportation Needs: Not on file  Physical Activity: Not on file  Stress: Not on file  Social Connections: Not on file     Family History: The patient's family history is negative for Aneurysm.  ROS:   Please see the history of present illness.    All other systems reviewed and are negative.  EKGs/Labs/Other Studies Reviewed Today:     EKG:  Last EKG results: today -- sinus rhythm  TTE 05/24/2022  1. No pericardial effusion.   2. Left ventricular ejection fraction, by estimation, is 55 to 60%. Left  ventricular ejection fraction by 3D volume is 59 %. The left ventricle has  normal function. The left ventricle has no regional wall motion  abnormalities. Left ventricular diastolic   parameters were normal. The average left ventricular global longitudinal  strain is -23.3 %. The global longitudinal strain is normal.   3. Right ventricular systolic function is normal. The right ventricular  size is normal. There is normal pulmonary artery systolic pressure. The  estimated right ventricular systolic pressure is 26.5 mmHg.   4. The mitral valve is grossly normal. Trivial mitral valve  regurgitation. No evidence of mitral stenosis.   5. The aortic valve is tricuspid. Aortic valve regurgitation is mild. No  aortic stenosis is present.   6. The inferior vena cava is dilated in size with >50% respiratory  variability, suggesting right atrial pressure of 8 mmHg.   Recent Labs: 04/05/2022: Creatinine, Ser 0.80     Physical Exam:    VS:  There were no vitals taken for this visit.    Wt Readings from Last 3 Encounters:  05/22/22 115 lb 6.4 oz (52.3 kg)  10/06/21 113 lb 12.8 oz (51.6 kg)  02/22/21 130 lb (59 kg)     GEN:  Well nourished, well developed in no acute distress CARDIAC: RRR, no murmurs,  rubs, gallops RESPIRATORY:  Normal work of breathing MUSCULOSKELETAL: no edema    ASSESSMENT & PLAN:    AF: Paroxysmal, highly symptomatic.       - schedule ablation We discussed the indication, rationale, logistics, anticipated benefits, and potential risks of the ablation procedure including but not limited to -- bleed at the groin access site, chest pain, damage to nearby organs such as the diaphragm, lungs, or esophagus, need for a drainage tube, or prolonged hospitalization. I explained that the risk for stroke, heart attack, need for open chest surgery, or even death is very low but not zero. she  expressed understanding and wishes to proceed.  2. Secondary hypercoagulable state        - she has increased bleeding risk with a spleenic artery aneurysm, and she reports symptoms of malaise and headache with apixaban. I think referral to discuss watchman is reasonable.        - continue eliquis for now         Medication Adjustments/Labs and Tests Ordered: Current medicines are reviewed at length with the patient today.  Concerns regarding medicines are outlined above.  No orders of the defined types were placed in this encounter.  No orders of the defined types were placed in this encounter.    Signed, Melida Quitter, MD  06/08/2022 2:26 PM    [place]

## 2022-06-08 NOTE — Telephone Encounter (Signed)
Pt saw Dr. Nelly Laurence today for consult.   Medication management was discussed with the pt at that visit.  Refer to OV note from today for further details.

## 2022-06-13 ENCOUNTER — Telehealth: Payer: Self-pay

## 2022-06-13 NOTE — Telephone Encounter (Signed)
The patient is scheduled for ablation 08/09/2022 with Dr. Nelly Laurence. Called to arrange Watchman consult.  Left message to call back.

## 2022-06-13 NOTE — Telephone Encounter (Signed)
Scheduled the patient for LAAO consult with Dr. Excell Seltzer 09/11/2022 with tentative implant date of 09/28/2022 if anatomy is suitable. She was grateful for assistance and agreed with plan.
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
## 2022-06-22 ENCOUNTER — Ambulatory Visit: Payer: Medicare Other | Attending: Physician Assistant | Admitting: Physician Assistant

## 2022-06-22 ENCOUNTER — Ambulatory Visit: Payer: Medicare Other | Admitting: Neurology

## 2022-06-22 ENCOUNTER — Encounter: Payer: Self-pay | Admitting: Physician Assistant

## 2022-06-22 VITALS — BP 122/58 | HR 57 | Ht 64.0 in | Wt 115.0 lb

## 2022-06-22 DIAGNOSIS — I4891 Unspecified atrial fibrillation: Secondary | ICD-10-CM | POA: Insufficient documentation

## 2022-06-22 DIAGNOSIS — J45909 Unspecified asthma, uncomplicated: Secondary | ICD-10-CM | POA: Diagnosis not present

## 2022-06-22 NOTE — Patient Instructions (Signed)
Medication Instructions:  Your physician recommends that you continue on your current medications as directed. Please refer to the Current Medication list given to you today.  *If you need a refill on your cardiac medications before your next appointment, please call your pharmacy*   Lab Work: None If you have labs (blood work) drawn today and your tests are completely normal, you will receive your results only by: MyChart Message (if you have MyChart) OR A paper copy in the mail If you have any lab test that is abnormal or we need to change your treatment, we will call you to review the results.   Follow-Up: At [facility], you and your health needs are our priority.  As part of our continuing mission to provide you with exceptional heart care, we have created designated Provider Care Teams.  These Care Teams include your primary Cardiologist (physician) and Advanced Practice Providers (APPs -  Physician Assistants and Nurse Practitioners) who all work together to provide you with the care you need, when you need it.   Your next appointments:   08/17/22 at 10:30 AM with Eligha Bridegroom, NP 09/11/22 at 11:20 AM with Dr Excell Seltzer   Important Information About Sugar

## 2022-06-26 ENCOUNTER — Ambulatory Visit: Payer: Medicare Other | Admitting: Psychiatry

## 2022-07-13 ENCOUNTER — Other Ambulatory Visit: Payer: Self-pay | Admitting: Radiology

## 2022-07-13 DIAGNOSIS — I728 Aneurysm of other specified arteries: Secondary | ICD-10-CM

## 2022-07-25 ENCOUNTER — Encounter: Payer: Self-pay | Admitting: Psychiatry

## 2022-07-25 ENCOUNTER — Ambulatory Visit: Payer: Medicare Other | Attending: Cardiovascular Disease

## 2022-07-25 ENCOUNTER — Ambulatory Visit (INDEPENDENT_AMBULATORY_CARE_PROVIDER_SITE_OTHER): Payer: Medicare Other | Admitting: Psychiatry

## 2022-07-25 VITALS — BP 121/60 | HR 49 | Ht 64.0 in | Wt 117.4 lb

## 2022-07-25 DIAGNOSIS — I480 Paroxysmal atrial fibrillation: Secondary | ICD-10-CM

## 2022-07-25 DIAGNOSIS — R410 Disorientation, unspecified: Secondary | ICD-10-CM

## 2022-07-25 LAB — CBC WITH DIFFERENTIAL/PLATELET

## 2022-07-25 NOTE — Progress Notes (Signed)
[facility]  PATIENT: Samantha Roy DOB: 1951/02/02  REFERRING CLINICIAN: Leana Roe, MD HISTORY FROM: self REASON FOR VISIT: dizziness, disorientation   HISTORICAL  CHIEF COMPLAINT:  Chief Complaint  Patient presents with   New Patient (Initial Visit)    Pt in room #1 and alone. Pt here today to discuss for episodes of dizziness and disorientation.    HISTORY OF PRESENT ILLNESS:  The patient presents for evaluation of episodes of dizziness and disorientation which began after she started Eliquis in November 2023. She states that immediately after starting Eliquis, she started to feel "loopy" and zoned out. Daughter would have to say her name several times to get her attention. She also developed severe headaches.  This would occur 30-40 minutes after taking her medication, then would improve after a couple of hours until her next dose. She does feel like her symptoms have been improving as her body has adjusted to the medication. Does not feel nearly as disoriented as she did previously. She denies any loss of consciousness, loss of continence, shaking/twitching, numbness, weakness, or vision changes.  She saw her PCP who ordered an MRI and carotid US, which were both unremarkable. Follows with Cardiology for afib, and does continue to have symptomatic palpitations with episodes of afib captured on her Apple Watch. She is scheduled for a Watchman/ablation 08/09/22.  She reports a history of head trauma 20 years ago.  OTHER MEDICAL CONDITIONS: GERD, diverticulosis, splenic artery aneurysm, asthma, afib, prediabetes   REVIEW OF SYSTEMS: Full 14 system review of systems performed and negative with exception of: dizziness, disorientation  ALLERGIES: Allergies  Allergen Reactions   Bee Venom Anaphylaxis   Codeine Swelling   Latex Rash    HOME MEDICATIONS: Outpatient Medications Prior to Visit  Medication Sig Dispense Refill   albuterol (VENTOLIN HFA)  108 (90 Base) MCG/ACT inhaler Inhale 2 puffs into the lungs every 6 (six) hours as needed.     apixaban (ELIQUIS) 5 MG TABS tablet Take 1 tablet (5 mg total) by mouth 2 (two) times daily. 60 tablet 11   Multiple Vitamin (MULTI-VITAMIN) tablet Take 1 tablet by mouth daily.     No facility-administered medications prior to visit.    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[place])     PAST SURGICAL HISTORY: Past Surgical History:  Procedure Laterality Date   CESAREAN SECTION     IR RADIOLOGIST EVAL & MGMT  04/21/2021   IR RADIOLOGIST EVAL & MGMT  08/18/2021   IR RADIOLOGIST EVAL & MGMT  04/11/2022    FAMILY HISTORY: Family History  Problem Relation Age of Onset   Cancer Mother    Stroke Father    Cancer Father    Lung cancer Father    Hypertension Father    Leukemia Maternal Grandmother    Depression Other    Bipolar disorder Other    Aneurysm Neg Hx     SOCIAL HISTORY: Social History   Socioeconomic History   Marital status: Widowed    Spouse name: Not on file   Number of children: Not on file   Years of education: Not on file   Highest education level: Not on file  Occupational History   Not on file  Tobacco Use   Smoking status: Never   Smokeless tobacco: Never  Substance and Sexual Activity   Alcohol use: Never  Drug use: Never   Sexual activity: Not on file  Other Topics Concern   Not on file  Social History Narrative   Not on file   Social Determinants of Health   Financial Resource Strain: Not on file  Food Insecurity: Not on file  Transportation Needs: Not on file  Physical Activity: Not on file  Stress: Not on file  Social Connections: Not on file  Intimate Partner Violence: Not on file     PHYSICAL EXAM  GENERAL EXAM/CONSTITUTIONAL: Vitals:  Vitals:   07/25/22 0821  BP: 121/60  Pulse: (!) 49  Weight: 117 lb 6.4 oz (53.3 kg)   Height: 5\' 4"  (1.626 m)   Body mass index is 20.15 kg/m. Wt Readings from Last 3 Encounters:  07/25/22 117 lb 6.4 oz (53.3 kg)  06/22/22 115 lb (52.2 kg)  06/08/22 114 lb 12.8 oz (52.1 kg)     NEUROLOGIC: MENTAL STATUS:   awake, alert, oriented to person, place and time recent and remote memory intact normal attention and concentration language fluent, comprehension intact, naming intact fund of knowledge appropriate  CRANIAL NERVE:  2nd, 3rd, 4th, 6th - pupils equal and reactive to light, visual fields full to confrontation, extraocular muscles intact, no nystagmus 5th - facial sensation symmetric 7th - facial strength symmetric 8th - hearing intact 9th - palate elevates symmetrically, uvula midline 11th - shoulder shrug symmetric 12th - tongue protrusion midline  MOTOR:  normal bulk and tone, full strength in the BUE, BLE  SENSORY:  normal and symmetric to light touch all 4 extremities  COORDINATION:  finger-nose-finger intact bilaterally  REFLEXES:  deep tendon reflexes present and symmetric  GAIT/STATION:  normal     DIAGNOSTIC DATA (LABS, IMAGING, TESTING) - I reviewed patient records, labs, notes, testing and imaging myself where available.  Lab Results  Component Value Date   WBC 6.7 02/23/2021   HGB 12.4 02/23/2021   HCT 36.5 02/23/2021   MCV 90.3 02/23/2021   PLT 256 02/23/2021      Component Value Date/Time   NA 138 02/23/2021 0540   K 4.1 02/23/2021 0540   CL 104 02/23/2021 0540   CO2 30 02/23/2021 0540   GLUCOSE 104 (H) 02/23/2021 0540   BUN 15 02/23/2021 0540   CREATININE 0.80 04/05/2022 1321   CALCIUM 9.0 02/23/2021 0540   GFRNONAA >60 02/23/2021 0540   06/06/22: TSH, CBC, CMP unremarkable  MRI brain 06/08/22: normal brain MRI for age  Carotid US 06/09/22: no flow limiting stenosis   ASSESSMENT AND PLAN  72 y.o. year old female with a history of GERD, diverticulosis, splenic artery aneurysm, asthma, afib, prediabetes who  presents for evaluation of episodes of headaches and disorientation. Neurological exam today is normal. Her symptom onset is temporally associated with initiation of Eliquis, and she strongly believes her symptoms are secondary to the medication. Although rare, there have been reported cases of disorientation with Eliquis. Other than periodic disorientation, she does not report any other symptoms concerning for seizure. As her symptoms have been improving over time, will plan to re-evaluate for improvement after her ablation/Watchman procedure on 08/09/21. Will plan for EEG if symptoms do not improve after discontinuation of Eliquis.   1. Disorientation       PLAN: -Will monitor for improvement following discontinuation of Eliquis after her Watchman/ablation on 08/09/21 -If no improvement in her symptoms at that time will plan for EEG   Return if symptoms worsen or fail to improve.    Genia Harold, MD  07/25/21 8:47 AM  I spent an average of 26 minutes chart reviewing and counseling the patient, with at least 50% of the time face to face with the patient.   [facility] [address] [place], [place] [address] [phone]

## 2022-07-26 LAB — CBC WITH DIFFERENTIAL/PLATELET
Basophils Absolute: 0.1 10*3/uL (ref 0.0–0.2)
Basos: 1 %
EOS (ABSOLUTE): 0.1 10*3/uL (ref 0.0–0.4)
Eos: 3 %
Hematocrit: 40 % (ref 34.0–46.6)
Hemoglobin: 13.1 g/dL (ref 11.1–15.9)
Immature Grans (Abs): 0 10*3/uL (ref 0.0–0.1)
Immature Granulocytes: 0 %
Lymphocytes Absolute: 2.4 10*3/uL (ref 0.7–3.1)
Lymphs: 43 %
MCH: 29.5 pg (ref 26.6–33.0)
MCHC: 32.8 g/dL (ref 31.5–35.7)
MCV: 90 fL (ref 79–97)
Monocytes Absolute: 0.5 10*3/uL (ref 0.1–0.9)
Monocytes: 10 %
Neutrophils Absolute: 2.3 10*3/uL (ref 1.4–7.0)
Neutrophils: 43 %
Platelets: 295 10*3/uL (ref 150–450)
RBC: 4.44 x10E6/uL (ref 3.77–5.28)
RDW: 11.9 % (ref 11.7–15.4)
WBC: 5.3 10*3/uL (ref 3.4–10.8)

## 2022-07-26 LAB — BASIC METABOLIC PANEL
BUN/Creatinine Ratio: 16 (ref 12–28)
BUN: 12 mg/dL (ref 8–27)
CO2: 26 mmol/L (ref 20–29)
Calcium: 9.4 mg/dL (ref 8.7–10.3)
Chloride: 101 mmol/L (ref 96–106)
Creatinine, Ser: 0.73 mg/dL (ref 0.57–1.00)
Glucose: 94 mg/dL (ref 70–99)
Potassium: 4.6 mmol/L (ref 3.5–5.2)
Sodium: 139 mmol/L (ref 134–144)
eGFR: 88 mL/min/{1.73_m2} (ref >59)

## 2022-08-01 ENCOUNTER — Telehealth: Payer: Self-pay | Admitting: Cardiology

## 2022-08-01 ENCOUNTER — Telehealth: Payer: Self-pay | Admitting: Nurse Practitioner

## 2022-08-01 DIAGNOSIS — I4891 Unspecified atrial fibrillation: Secondary | ICD-10-CM

## 2022-08-01 MED ORDER — APIXABAN 5 MG PO TABS: 5.0000 mg | ORAL_TABLET | Freq: Two times a day (BID) | ORAL | 1 refills | Status: DC

## 2022-08-01 NOTE — Telephone Encounter (Signed)
Patient called to talk with Dr. Mealor or nurse. Please call back 

## 2022-08-01 NOTE — Telephone Encounter (Signed)
Eliquis 5mg  refill request received. Patient is 72 years old, weight-53.3kg, Crea-0.73 on 07/25/2022, Diagnosis-Afib, and last seen by Nicholes Rough on 06/22/2022. Dose is appropriate based on dosing criteria. Will send in refill to requested pharmacy.

## 2022-08-01 NOTE — Telephone Encounter (Signed)
*  STAT* If patient is at the pharmacy, call can be transferred to refill team.   1. Which medications need to be refilled? (please list name of each medication and dose if known) apixaban (ELIQUIS) 5 MG TABS tablet   2. Which pharmacy/location (including street and city if local pharmacy) is medication to be sent to?  [place] [address]    3. Do they need a 30 day or 90 day supply?  90 day

## 2022-08-03 ENCOUNTER — Encounter (HOSPITAL_COMMUNITY): Payer: Self-pay

## 2022-08-03 ENCOUNTER — Telehealth (HOSPITAL_COMMUNITY): Payer: Self-pay | Admitting: *Deleted

## 2022-08-03 NOTE — Telephone Encounter (Signed)
Reaching out to patient to offer assistance regarding upcoming cardiac imaging study; pt verbalizes understanding of appt date/time, parking situation and where to check in, pre-test NPO status, and verified current allergies; name and call back number provided for further questions should they arise  Dia Donate RN Navigator Cardiac Imaging [facility] [phone] office [phone] cell  Patient aware to arrive at 1pm. 

## 2022-08-04 ENCOUNTER — Ambulatory Visit (HOSPITAL_COMMUNITY)
Admission: RE | Admit: 2022-08-04 | Discharge: 2022-08-04 | Disposition: A | Discharge status: Home / Self Care | Payer: Medicare Other | Source: Ambulatory Visit | Attending: Cardiovascular Disease | Admitting: Cardiovascular Disease

## 2022-08-04 ENCOUNTER — Ambulatory Visit
Admission: RE | Admit: 2022-08-04 | Discharge: 2022-08-04 | Disposition: A | Discharge status: Home / Self Care | Payer: Medicare Other | Source: Ambulatory Visit | Attending: Interventional Radiology | Admitting: Interventional Radiology

## 2022-08-04 DIAGNOSIS — I480 Paroxysmal atrial fibrillation: Secondary | ICD-10-CM | POA: Diagnosis present

## 2022-08-04 DIAGNOSIS — I728 Aneurysm of other specified arteries: Secondary | ICD-10-CM

## 2022-08-04 HISTORY — PX: IR RADIOLOGIST EVAL & MGMT: IMG5224

## 2022-08-04 MED ORDER — NITROGLYCERIN 0.4 MG SL SUBL
0.8000 mg | SUBLINGUAL_TABLET | SUBLINGUAL | Status: DC | PRN
  Administered 2022-08-04: 0.8 mg via SUBLINGUAL

## 2022-08-04 MED ORDER — IOHEXOL 350 MG/ML SOLN
100.0000 mL | Freq: Once | INTRAVENOUS | Status: AC | PRN
  Administered 2022-08-04: 100 mL via INTRAVENOUS

## 2022-08-04 MED ORDER — NITROGLYCERIN 0.4 MG SL SUBL
SUBLINGUAL_TABLET | SUBLINGUAL | Status: AC
  Filled 2022-08-04: qty 2

## 2022-08-04 NOTE — Progress Notes (Signed)
Chief Complaint: Splenic Artery Aneurysm  Referring Physician(s): Dr. Loleta Books, [facility] Cardiology: Dr. Johney Frame, Dr. Myles Gip     History of Present Illness: Samantha Roy is a 72 y.o. female presenting today as a scheduled appointment per her request.    Prior History:    We met Samantha Roy during admission at [facility], when she was referred for evaluation of abdominal pain in the setting of a splenic artery aneurysm discovered on CTA.     On her ED admission, CTA followed a routine contrast CT, showing a rim calcified splenic hilar aneurysm ~45mm.  No inflammation or fluid.  No evidence of hemorrhage.    She was observed during the admission and DC'd 02/23/21.    We saw her in follow up 04/21/2021, and she was doing fine.     We saw her again 08/18/21, and she has since started cardiology care with Dr. Johney Frame, specifically regarding diagnosis of afib.   Interval History:  Samantha Roy is doing fine, with no new symptoms from the standpoint of the splenic artery aneurysm.   She does have worsening symptoms related to her afib, which she took the opportunity to discuss today.  She has upcoming appointment for possible ablation and watchman procedure with EP, it seems, based on her symptoms.   We also reviewed her recent lab work from 1/2, which she asked about.   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 Authorizing Provider  albuterol (VENTOLIN HFA) 108 (90 Base) MCG/ACT inhaler Inhale 2 puffs into the lungs every 6 (six) hours  as needed.    [provider]  apixaban (ELIQUIS) 5 MG TABS tablet Take 1 tablet (5 mg total) by mouth 2 (two) times daily. 08/01/22   [name], MD  Multiple Vitamin (MULTI-VITAMIN) tablet Take 1 tablet by mouth daily.    [provider]     Family History  Problem Relation Age of Onset   Cancer Mother    Stroke Father    Cancer Father    Lung cancer Father    Hypertension Father    Leukemia Maternal Grandmother    Depression Other    Bipolar disorder Other    Aneurysm Neg Hx     Social History   Socioeconomic History   Marital status: Widowed    Spouse name: Not on file   Number of children: Not on file   Years of education: Not on file   Highest education level: Not on file  Occupational History   Not on file  Tobacco Use   Smoking status: Never   Smokeless tobacco: Never  Substance and Sexual Activity   Alcohol use: Never   Drug use: Never   Sexual activity: Not on file  Other Topics Concern   Not on file  Social History Narrative   Not on file   Social Determinants of Health   Financial Resource Strain: Not on file  Food Insecurity: Not on file  Transportation Needs: Not on file  Physical Activity: Not on file  Stress: Not on file  Social Connections: Not on file       Review of Systems  Review of Systems: A 12 point ROS discussed and pertinent positives are indicated in the HPI above.  All other systems are negative.     Physical Exam No direct physical exam was performed (except for noted visual exam findings with Video Visits).    Vital Signs: There were no vitals taken for this visit.  Imaging: No results found.  Labs:  CBC: Recent Labs    07/25/22 0858  WBC 5.3  HGB 13.1  HCT 40.0  PLT 295    COAGS: No results for input(s): "INR", "APTT" in the last 8760 hours.  BMP: Recent Labs    04/05/22 1321 07/25/22 0858  NA  --  139  K  --  4.6  CL  --  101  CO2  --  26  GLUCOSE  --  94  BUN  --  12   CALCIUM  --  9.4  CREATININE 0.80 0.73    LIVER FUNCTION TESTS: No results for input(s): "BILITOT", "AST", "ALT", "ALKPHOS", "PROT", "ALBUMIN" in the last 8760 hours.  TUMOR MARKERS: No results for input(s): "AFPTM", "CEA", "CA199", "CHROMGRNA" in the last 8760 hours.  Assessment and Plan:  Samantha Roy is very pleasant 72 yo female with history of splenic artery aneurysm, low risk, presenting today at her request.   She has afib, with upcoming appointment with EP for possible ablation/watchman procedure.   We reviewed her recent lab work today at her request, and I did let her know that we will plan on another follow up this fall in September/October with CTA to observe her splenic aneurysm.   Plan: - follow up office visit in September/October with CTA abdomen  Electronically Signed: Corrie Mckusick 08/04/2022, 9:20 AM   I spent a total of    10 Minutes in remote  clinical consultation, greater than 50% of which was counseling/coordinating care for splenic artery aneurysm.    Visit type: Audio only (telephone). Audio (no video) only due to patient's lack of internet/smartphone capability. Alternative for in-person consultation at [facility], [address] [place], [place], [place]. This visit type was conducted due to national recommendations for restrictions regarding the COVID-19 Pandemic (e.g. social distancing).  This format is felt to be most appropriate for this patient at this time.  All issues noted in this document were discussed and addressed.

## 2022-08-08 ENCOUNTER — Telehealth: Payer: Self-pay

## 2022-08-08 NOTE — Telephone Encounter (Signed)
*  late post*  I spoke with pt on Friday 08/04/22 and she stated that she had missed 2 doses of Eliquis 3-5 weeks ago but she couldn't remember the exact days. After speaking with Dr. Myles Gip I advised her that it was ok since this was prior to her CT scan but to NOT miss any additional doses prior to her procedure. If she does she is to call us ASAP.

## 2022-08-08 NOTE — Pre-Procedure Instructions (Signed)
Instructed patient on the following items: °Arrival time 0930 °Nothing to eat or drink after midnight °No meds AM of procedure °Responsible person to drive you home and stay with you for 24 hrs ° °Have you missed any doses of anti-coagulant Eliquis- hasn't missed any doses °   °

## 2022-08-09 ENCOUNTER — Other Ambulatory Visit (HOSPITAL_COMMUNITY): Payer: Self-pay

## 2022-08-09 ENCOUNTER — Encounter (HOSPITAL_COMMUNITY)
Admission: RE | Disposition: A | Discharge status: Home / Self Care | Payer: Self-pay | Source: Home / Self Care | Attending: Cardiovascular Disease

## 2022-08-09 ENCOUNTER — Other Ambulatory Visit: Payer: Self-pay

## 2022-08-09 ENCOUNTER — Ambulatory Visit (HOSPITAL_COMMUNITY): Payer: Medicare Other | Admitting: Certified Registered Nurse Anesthetist

## 2022-08-09 ENCOUNTER — Ambulatory Visit (HOSPITAL_COMMUNITY)
Admission: RE | Admit: 2022-08-09 | Discharge: 2022-08-09 | Disposition: A | Discharge status: Home / Self Care | Payer: Medicare Other | Attending: Cardiovascular Disease | Admitting: Cardiovascular Disease

## 2022-08-09 ENCOUNTER — Ambulatory Visit (HOSPITAL_BASED_OUTPATIENT_CLINIC_OR_DEPARTMENT_OTHER): Payer: Medicare Other | Admitting: Certified Registered Nurse Anesthetist

## 2022-08-09 DIAGNOSIS — I480 Paroxysmal atrial fibrillation: Secondary | ICD-10-CM | POA: Diagnosis present

## 2022-08-09 DIAGNOSIS — I4891 Unspecified atrial fibrillation: Secondary | ICD-10-CM | POA: Diagnosis not present

## 2022-08-09 DIAGNOSIS — D6869 Other thrombophilia: Secondary | ICD-10-CM | POA: Insufficient documentation

## 2022-08-09 DIAGNOSIS — I728 Aneurysm of other specified arteries: Secondary | ICD-10-CM | POA: Insufficient documentation

## 2022-08-09 DIAGNOSIS — Z7901 Long term (current) use of anticoagulants: Secondary | ICD-10-CM | POA: Insufficient documentation

## 2022-08-09 DIAGNOSIS — J449 Chronic obstructive pulmonary disease, unspecified: Secondary | ICD-10-CM

## 2022-08-09 DIAGNOSIS — K219 Gastro-esophageal reflux disease without esophagitis: Secondary | ICD-10-CM | POA: Insufficient documentation

## 2022-08-09 HISTORY — PX: ATRIAL FIBRILLATION ABLATION: EP1191

## 2022-08-09 LAB — POCT ACTIVATED CLOTTING TIME
Activated Clotting Time: 223 seconds
Activated Clotting Time: 271 seconds

## 2022-08-09 SURGERY — ATRIAL FIBRILLATION ABLATION
Anesthesia: General

## 2022-08-09 MED ORDER — HEPARIN SODIUM (PORCINE) 1000 UNIT/ML IJ SOLN
INTRAMUSCULAR | Status: DC | PRN
  Administered 2022-08-09: 1000 [IU] via INTRAVENOUS

## 2022-08-09 MED ORDER — ACETAMINOPHEN 10 MG/ML IV SOLN
INTRAVENOUS | Status: DC | PRN
  Administered 2022-08-09: 1000 mg via INTRAVENOUS

## 2022-08-09 MED ORDER — DOBUTAMINE INFUSION FOR EP/ECHO/NUC (1000 MCG/ML)
INTRAVENOUS | Status: DC | PRN
  Administered 2022-08-09: 20 ug/kg/min via INTRAVENOUS

## 2022-08-09 MED ORDER — PROPOFOL 10 MG/ML IV BOLUS
INTRAVENOUS | Status: DC | PRN
  Administered 2022-08-09: 150 mg via INTRAVENOUS
  Administered 2022-08-09: 40 mg via INTRAVENOUS

## 2022-08-09 MED ORDER — SODIUM CHLORIDE 0.9 % IV SOLN: 250.0000 mL | INTRAVENOUS | Status: DC | PRN

## 2022-08-09 MED ORDER — SODIUM CHLORIDE 0.9% FLUSH: 3.0000 mL | INTRAVENOUS | Status: DC | PRN

## 2022-08-09 MED ORDER — DEXAMETHASONE SODIUM PHOSPHATE 10 MG/ML IJ SOLN
INTRAMUSCULAR | Status: DC | PRN
  Administered 2022-08-09: 10 mg via INTRAVENOUS

## 2022-08-09 MED ORDER — SODIUM CHLORIDE 0.9 % IV SOLN: INTRAVENOUS | Status: DC

## 2022-08-09 MED ORDER — COLCHICINE 0.6 MG PO TABS
0.6000 mg | ORAL_TABLET | Freq: Two times a day (BID) | ORAL | 0 refills | Status: DC
  Filled 2022-08-09: qty 10, 5d supply, fill #0

## 2022-08-09 MED ORDER — LIDOCAINE 2% (20 MG/ML) 5 ML SYRINGE
INTRAMUSCULAR | Status: DC | PRN
  Administered 2022-08-09: 30 mg via INTRAVENOUS

## 2022-08-09 MED ORDER — SUGAMMADEX SODIUM 200 MG/2ML IV SOLN
INTRAVENOUS | Status: DC | PRN
  Administered 2022-08-09: 200 mg via INTRAVENOUS

## 2022-08-09 MED ORDER — PANTOPRAZOLE SODIUM 40 MG PO TBEC
40.0000 mg | DELAYED_RELEASE_TABLET | Freq: Every day | ORAL | 0 refills | Status: DC
  Filled 2022-08-09: qty 45, 45d supply, fill #0

## 2022-08-09 MED ORDER — FENTANYL CITRATE (PF) 100 MCG/2ML IJ SOLN
INTRAMUSCULAR | Status: AC
  Filled 2022-08-09: qty 2

## 2022-08-09 MED ORDER — DOBUTAMINE INFUSION FOR EP/ECHO/NUC (1000 MCG/ML)
INTRAVENOUS | Status: AC
  Filled 2022-08-09: qty 250

## 2022-08-09 MED ORDER — ONDANSETRON HCL 4 MG/2ML IJ SOLN: 4.0000 mg | Freq: Four times a day (QID) | INTRAMUSCULAR | Status: DC | PRN

## 2022-08-09 MED ORDER — HEPARIN SODIUM (PORCINE) 1000 UNIT/ML IJ SOLN
INTRAMUSCULAR | Status: DC | PRN
  Administered 2022-08-09: 8000 [IU] via INTRAVENOUS
  Administered 2022-08-09 (×2): 3000 [IU] via INTRAVENOUS

## 2022-08-09 MED ORDER — ONDANSETRON HCL 4 MG/2ML IJ SOLN
INTRAMUSCULAR | Status: DC | PRN
  Administered 2022-08-09: 4 mg via INTRAVENOUS

## 2022-08-09 MED ORDER — ROCURONIUM BROMIDE 10 MG/ML (PF) SYRINGE
PREFILLED_SYRINGE | INTRAVENOUS | Status: DC | PRN
  Administered 2022-08-09: 60 mg via INTRAVENOUS
  Administered 2022-08-09: 30 mg via INTRAVENOUS

## 2022-08-09 MED ORDER — PHENYLEPHRINE HCL-NACL 20-0.9 MG/250ML-% IV SOLN
INTRAVENOUS | Status: DC | PRN
  Administered 2022-08-09: 20 ug/min via INTRAVENOUS

## 2022-08-09 MED ORDER — PROTAMINE SULFATE 10 MG/ML IV SOLN
INTRAVENOUS | Status: DC | PRN
  Administered 2022-08-09: 40 mg via INTRAVENOUS

## 2022-08-09 MED ORDER — HEPARIN SODIUM (PORCINE) 1000 UNIT/ML IJ SOLN
INTRAMUSCULAR | Status: AC
  Filled 2022-08-09: qty 10

## 2022-08-09 MED ORDER — HEPARIN (PORCINE) IN NACL 1000-0.9 UT/500ML-% IV SOLN
INTRAVENOUS | Status: DC | PRN
  Administered 2022-08-09 (×4): 500 mL

## 2022-08-09 MED ORDER — ACETAMINOPHEN 325 MG PO TABS: 650.0000 mg | ORAL_TABLET | ORAL | Status: DC | PRN

## 2022-08-09 MED ORDER — FENTANYL CITRATE (PF) 250 MCG/5ML IJ SOLN
INTRAMUSCULAR | Status: DC | PRN
  Administered 2022-08-09: 100 ug via INTRAVENOUS

## 2022-08-09 SURGICAL SUPPLY — 20 items
BAG SNAP BAND KOVER 36X36 (MISCELLANEOUS) IMPLANT
CATH 8FR REPROCESSED SOUNDSTAR (CATHETERS) ×1 IMPLANT
CATH 8FR SOUNDSTAR REPROCESSED (CATHETERS) IMPLANT
CATH ABLAT QDOT MICRO BI TC FJ (CATHETERS) IMPLANT
CATH OCTARAY 1.5 F (CATHETERS) IMPLANT
CATH PIGTAIL STEERABLE D1 8.7 (WIRE) IMPLANT
CATH S-M CIRCA TEMP PROBE (CATHETERS) IMPLANT
CATH WEB BI DIR CSDF CRV REPRO (CATHETERS) IMPLANT
CLOSURE PERCLOSE PROSTYLE (VASCULAR PRODUCTS) IMPLANT
COVER SWIFTLINK CONNECTOR (BAG) ×1 IMPLANT
DEVICE CLOSURE MYNXGRIP 6/7F (Vascular Products) IMPLANT
PACK EP LATEX FREE (CUSTOM PROCEDURE TRAY) ×1
PACK EP LF (CUSTOM PROCEDURE TRAY) ×1 IMPLANT
PAD DEFIB RADIO PHYSIO CONN (PAD) ×1 IMPLANT
PATCH CARTO3 (PAD) IMPLANT
SHEATH CARTO VIZIGO MED CURVE (SHEATH) IMPLANT
SHEATH PINNACLE 8F 10CM (SHEATH) IMPLANT
SHEATH PINNACLE 9F 10CM (SHEATH) IMPLANT
SHEATH PROBE COVER 6X72 (BAG) IMPLANT
TUBING SMART ABLATE COOLFLOW (TUBING) IMPLANT

## 2022-08-09 NOTE — Transfer of Care (Signed)
Immediate Anesthesia Transfer of Care Note  Patient: Samantha Roy  Procedure(s) Performed: ATRIAL FIBRILLATION ABLATION  Patient Location: Cath Lab  Anesthesia Type:General  Level of Consciousness: drowsy and patient cooperative  Airway & Oxygen Therapy: Patient Spontanous Breathing and Patient connected to nasal cannula oxygen  Post-op Assessment: Report given to RN, Post -op Vital signs reviewed and stable, and Patient moving all extremities X 4  Post vital signs: Reviewed and stable  Last Vitals:  Vitals Value Taken Time  BP 113/63 08/09/22 1337  Temp    Pulse 73 08/09/22 1338  Resp 13 08/09/22 1338  SpO2 95 % 08/09/22 1338  Vitals shown include unvalidated device data.  Last Pain:  Vitals:   08/09/22 0957  TempSrc: Oral         Complications: There were no known notable events for this encounter.

## 2022-08-09 NOTE — Anesthesia Postprocedure Evaluation (Signed)
Anesthesia Post Note  Patient: Samantha Roy  Procedure(s) Performed: ATRIAL FIBRILLATION ABLATION     Patient location during evaluation: Phase II Anesthesia Type: General Level of consciousness: awake and alert, patient cooperative and oriented Pain management: pain level controlled Vital Signs Assessment: post-procedure vital signs reviewed and stable Respiratory status: spontaneous breathing, nonlabored ventilation and respiratory function stable Cardiovascular status: blood pressure returned to baseline and stable Postop Assessment: no apparent nausea or vomiting and adequate PO intake Anesthetic complications: no   There were no known notable events for this encounter.  Last Vitals:  Vitals:   08/09/22 1420 08/09/22 1425  BP: 99/66 96/64  Pulse: 67 67  Resp: 16 15  Temp:    SpO2: 96% 95%    Last Pain:  Vitals:   08/09/22 0957  TempSrc: Oral                 Collen Hostler,E. Virgin Zellers

## 2022-08-09 NOTE — Progress Notes (Signed)
Patient and sister was given discharge instructions. Both verbalized understanding.

## 2022-08-09 NOTE — Anesthesia Preprocedure Evaluation (Addendum)
Anesthesia Evaluation  Patient identified by MRN, date of birth, ID band Patient awake    Reviewed: Allergy & Precautions, NPO status , Patient's Chart, lab work & pertinent test results  History of Anesthesia Complications Negative for: history of anesthetic complications  Airway Mallampati: I  TM Distance: >3 FB Neck ROM: Full    Dental  (+) Dental Advisory Given   Pulmonary asthma , COPD,  COPD inhaler   breath sounds clear to auscultation       Cardiovascular (-) angina + dysrhythmias Atrial Fibrillation  Rhythm:Irregular Rate:Normal  05/2022 ECHO: EF 55-60%, EF 59%, normal LVF, normal RVF, no significant valvular abnormalities   Neuro/Psych negative neurological ROS     GI/Hepatic Neg liver ROS,GERD  Controlled,,  Endo/Other  negative endocrine ROS    Renal/GU negative Renal ROS     Musculoskeletal   Abdominal   Peds  Hematology eliquis   Anesthesia Other Findings   Reproductive/Obstetrics                             Anesthesia Physical Anesthesia Plan  ASA: 3  Anesthesia Plan: General   Post-op Pain Management: Ofirmev IV (intra-op)*   Induction: Intravenous  PONV Risk Score and Plan: 3 and Ondansetron, Dexamethasone and Treatment may vary due to age or medical condition  Airway Management Planned: Oral ETT  Additional Equipment: None  Intra-op Plan:   Post-operative Plan: Extubation in OR  Informed Consent: I have reviewed the patients History and Physical, chart, labs and discussed the procedure including the risks, benefits and alternatives for the proposed anesthesia with the patient or authorized representative who has indicated his/her understanding and acceptance.     Dental advisory given  Plan Discussed with: CRNA and Surgeon  Anesthesia Plan Comments:        Anesthesia Quick Evaluation

## 2022-08-09 NOTE — Anesthesia Procedure Notes (Signed)
Procedure Name: Intubation Date/Time: 08/09/2022 11:51 AM  Performed by: Annye Asa, MDPre-anesthesia Checklist: Patient identified, Emergency Drugs available, Suction available and Patient being monitored Patient Re-evaluated:Patient Re-evaluated prior to induction Oxygen Delivery Method: Circle system utilized Preoxygenation: Pre-oxygenation with 100% oxygen Induction Type: IV induction Ventilation: Mask ventilation without difficulty Laryngoscope Size: Mac and 3 Grade View: Grade I Tube type: Oral Tube size: 6.5 mm Number of attempts: 1 Airway Equipment and Method: Stylet and Oral airway Placement Confirmation: ETT inserted through vocal cords under direct vision, positive ETCO2 and breath sounds checked- equal and bilateral Secured at: 21 cm Tube secured with: Tape Dental Injury: Teeth and Oropharynx as per pre-operative assessment

## 2022-08-09 NOTE — Discharge Instructions (Signed)

## 2022-08-09 NOTE — H&P (Signed)
Electrophysiology Office Note:    Date:  08/09/2022   ID:  Samantha Roy, DOB 04-25-1951, MRN [id number]  PCP:  Leana Roe, MD   [facility] Cardiologist:  [name], MD Electrophysiologist:  Melida Quitter, MD     Referring MD: No ref. provider found   Chief complaint: palpitations  History of Present Illness:    Samantha Roy is a 72 y.o. female with a hx of Afib, splenic artery aneurysm, asthma, GERD referred for AF management.  She was evaluated for palpitations by [facility] cardiology in January 2023. A cardiac monitor was placed and detected atrial fibrillation. She was seen at [facility] in March. ZioPatch at that time showed breif NSVT and nonsustained SVT but no AF.   She has continued to have intermittent palpitations. She is markedly symptomatic with episodes. She sometimes has to lie down to avoid passing out. She is not able to function at all during episodes. She has captured episodes of AF on her apple watch.  I reviewed the patient's CT and labs. There was no LAA thrombus. she  has not missed any doses of anticoagulation, and she took her dose last night. There have been no changes in the patient's diagnoses, medications, or condition since our recent clinic visit.    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     Allergies:   Bee venom, Codeine, and Latex   Social History   Socioeconomic History   Marital status: Widowed    Spouse name: Not on file    Number of children: Not on file   Years of education: Not on file   Highest education level: Not on file  Occupational History   Not on file  Tobacco Use   Smoking status: Never   Smokeless tobacco: Never  Substance and Sexual Activity   Alcohol use: Never   Drug use: Never   Sexual activity: Not on file  Other Topics Concern   Not on file  Social History Narrative   Not on file   Social Determinants of Health   Financial Resource Strain: Not on file  Food Insecurity: Not on file  Transportation Needs: Not on file  Physical Activity: Not on file  Stress: Not on file  Social Connections: Not on file     Family History: The patient's family history includes Bipolar disorder in an other family member; Cancer in her father and mother; Depression in an other family member; Hypertension in her father; Leukemia in her maternal grandmother; Lung cancer in her father; Stroke in her father. There is no history of Aneurysm.  ROS:   Please see the history of present illness.    All other systems reviewed and are negative.  EKGs/Labs/Other Studies Reviewed Today:     EKG:  Last EKG results: today -- sinus rhythm  TTE 05/24/2022  1. No pericardial effusion.   2. Left  ventricular ejection fraction, by estimation, is 55 to 60%. Left  ventricular ejection fraction by 3D volume is 59 %. The left ventricle has  normal function. The left ventricle has no regional wall motion  abnormalities. Left ventricular diastolic   parameters were normal. The average left ventricular global longitudinal  strain is -23.3 %. The global longitudinal strain is normal.   3. Right ventricular systolic function is normal. The right ventricular  size is normal. There is normal pulmonary artery systolic pressure. The  estimated right ventricular systolic pressure is 33.8 mmHg.   4. The mitral valve is grossly normal. Trivial mitral valve  regurgitation. No evidence of mitral stenosis.   5. The aortic valve  is tricuspid. Aortic valve regurgitation is mild. No  aortic stenosis is present.   6. The inferior vena cava is dilated in size with >50% respiratory  variability, suggesting right atrial pressure of 8 mmHg.   Recent Labs: 07/25/2022: BUN 12; Creatinine, Ser 0.73; Hemoglobin 13.1; Platelets 295; Potassium 4.6; Sodium 139     Physical Exam:    VS:  BP (!) 143/70   Pulse 63   Temp 97.9 F (36.6 C) (Oral)   Resp 14   Ht 5\' 4"  (1.626 m)   Wt 49.9 kg   SpO2 95%   BMI 18.88 kg/m     Wt Readings from Last 3 Encounters:  08/09/22 49.9 kg  07/25/22 53.3 kg  06/22/22 52.2 kg     GEN:  Well nourished, well developed in no acute distress CARDIAC: RRR, no murmurs, rubs, gallops RESPIRATORY:  Normal work of breathing MUSCULOSKELETAL: no edema    ASSESSMENT & PLAN:    AF: Paroxysmal, highly symptomatic.       - will proceed with ablation today  2. Secondary hypercoagulable state        - she has increased bleeding risk with a spleenic artery aneurysm, and she reports symptoms of malaise and headache with apixaban. I think referral to discuss watchman is reasonable.        - resume eliquis tonight         Medication Adjustments/Labs and Tests Ordered: Current medicines are reviewed at length with the patient today.  Concerns regarding medicines are outlined above.  Orders Placed This Encounter  Procedures   Informed Consent Details: Physician/Practitioner Attestation; Transcribe to consent form and obtain patient signature   Initiate Pre-op Protocol   Void on call to EP Lab   Confirm CBC and BMP (or CMP) results within 7 days for inpatient and 30 days for outpatient:   Clip right and left femoral area PM before surgery   Clip right internal jugular area PM before surgery   Pre-admission testing diagnosis   EP STUDY   Insert peripheral IV   Meds ordered this encounter  Medications   0.9 %  sodium chloride infusion   fentaNYL (SUBLIMAZE) 100 MCG/2ML injection     Muqtasid, Samantha T: cabinet override     Signed, Melida Quitter, MD  08/09/2022 11:17 AM    [place]

## 2022-08-10 ENCOUNTER — Encounter (HOSPITAL_COMMUNITY): Payer: Self-pay | Admitting: Cardiovascular Disease

## 2022-08-14 ENCOUNTER — Telehealth: Payer: Self-pay | Admitting: Cardiovascular Disease

## 2022-08-14 NOTE — Telephone Encounter (Signed)
Spoke with the patient who states that her heart has been pounding and she has been dizzy. She states that when her heart pounds it is painful. She states blood pressure is 110/68 and heart rate 91. She states it is similar symptoms to how she felt in A Fib in the past other than the pain. Advised that it is not uncommon to have some breakthrough Afib to occur.  Patient finished her last dose of colchicine today. She was taking protonix but stopped because she thought it was upsetting her stomach. She states that overall symptoms have settled down and she feels better when she is lying down. Advised to continue to rest and monitor symptoms. Will check with Dr. Myles Gip for any further recommendations.

## 2022-08-14 NOTE — Telephone Encounter (Signed)
Patient c/o Palpitations:  High priority if patient c/o lightheadedness, shortness of breath, or chest pain  How long have you had palpitations/irregular HR/ Afib? Are you having the symptoms now? yes  Are you currently experiencing lightheadedness, SOB or CP? Dizziness, chest pain   Do you have a history of afib (atrial fibrillation) or irregular heart rhythm? yes  Have you checked your BP or HR? (document readings if available): 110/68 HR 91;  Are you experiencing any other symptoms? Heart is racing

## 2022-08-15 ENCOUNTER — Telehealth: Payer: Self-pay | Admitting: Cardiology

## 2022-08-15 MED ORDER — COLCHICINE 0.6 MG PO TABS: 0.6000 mg | ORAL_TABLET | Freq: Two times a day (BID) | ORAL | 0 refills | Status: DC

## 2022-08-15 NOTE — Telephone Encounter (Signed)
Returned call to patient.  Patient reports experiencing an episode of a-fib today which caused her to feel anxious and she has remaining chest/heart soreness from this episode.  BP and HR today after a-fib episode was 126/74. HR 90s.  Patient denies any SOB.  Explained to patient is is not usual to have some episodes of a-fib after an ablation. Discussed symptoms with Dr. Myles Gip: can send a refill for colchine 0.6mg  BID for 3 more days.  Sent to pharmacy of choice.  Patient has F/U appt with APP on 08/21/2022. Advised patient to keep all follow-up appointments as scheduled.  Patient verbalized understanding of the above and expressed appreciation for call.

## 2022-08-15 NOTE — Telephone Encounter (Signed)
Pt c/o medication issue:  1. Name of Medication:   colchicine 0.6 MG tablet (Expired)   2. How are you currently taking this medication (dosage and times per day)?   As prescribed  3. Are you having a reaction (difficulty breathing--STAT)?   No  4. What is your medication issue?   Patient stated she was started on this medication while she was in the hospital and she is now out of this medication.  Patient wants to know if she should continue on this medication then she will need a refill. Patient is concerned if the afib she experienced is related to this medication change and states her heart is still extremely sore.

## 2022-08-17 ENCOUNTER — Ambulatory Visit: Payer: Medicare Other | Admitting: Nurse Practitioner

## 2022-08-21 ENCOUNTER — Ambulatory Visit: Payer: Medicare Other | Admitting: Physician Assistant

## 2022-08-23 ENCOUNTER — Encounter: Payer: Self-pay | Admitting: *Deleted

## 2022-08-25 ENCOUNTER — Ambulatory Visit: Payer: Medicare Other | Admitting: Physician Assistant

## 2022-09-06 ENCOUNTER — Encounter (HOSPITAL_COMMUNITY): Payer: Self-pay | Admitting: Physician Assistant

## 2022-09-06 ENCOUNTER — Ambulatory Visit (HOSPITAL_COMMUNITY)
Admission: RE | Admit: 2022-09-06 | Discharge: 2022-09-06 | Disposition: A | Discharge status: Home / Self Care | Payer: Medicare Other | Source: Ambulatory Visit | Attending: Physician Assistant | Admitting: Physician Assistant

## 2022-09-06 VITALS — BP 108/74 | HR 76 | Ht 64.0 in | Wt 111.2 lb

## 2022-09-06 DIAGNOSIS — Z7901 Long term (current) use of anticoagulants: Secondary | ICD-10-CM | POA: Insufficient documentation

## 2022-09-06 DIAGNOSIS — K219 Gastro-esophageal reflux disease without esophagitis: Secondary | ICD-10-CM | POA: Insufficient documentation

## 2022-09-06 DIAGNOSIS — I480 Paroxysmal atrial fibrillation: Secondary | ICD-10-CM

## 2022-09-06 DIAGNOSIS — I728 Aneurysm of other specified arteries: Secondary | ICD-10-CM | POA: Insufficient documentation

## 2022-09-06 NOTE — Progress Notes (Signed)
Primary Care Physician: Leana Roe, MD Primary Cardiologist: Dr Johney Frame  Primary Electrophysiologist: Dr Myles Gip Referring Physician: Dr Myles Gip   Samantha Roy is a 72 y.o. female with a history of GERD and atrial fibrillation who presents for follow up in the [facility]. She was evaluated for palpitations by [facility] cardiology in January 2023.  Monitor was placed and atrial fibrillation was detected.  She was seen at [facility] in March and Zio patch at that time showed 3 NSVT and nonsustained SVT but no AF.  She continued to have intermittent palpitations and was remarkably symptomatic with episodes.  She was seen by EP 06/08/2022.  She was having to lay down at times to avoid passing out.  She was unable to function at all during the episodes.  She did have captured episodes of AF on Apple Watch. Patient is on Eliquis for a CHADS2VASC score of 2.  On follow up today, patient is s/p afib ablation 08/09/22. She has had some episodes of intermittent afib since the ablation. Three days post ablation she had a very symptomatic episode lasting 3 hours. Since then, her episodes are only 15 minutes which is an improvement compared to before ablation. She denies current chest pain, swallowing pain, or groin issues.   Today, she denies symptoms of palpitations, chest pain, shortness of breath, orthopnea, PND, lower extremity edema, dizziness, presyncope, syncope, snoring, daytime somnolence, bleeding, or neurologic sequela. The patient is tolerating medications without difficulties and is otherwise without complaint today.    Atrial Fibrillation Risk Factors:  she does not have symptoms or diagnosis of sleep apnea. she does not have a history of rheumatic fever.   she has a BMI of Body mass index is 19.09 kg/m.Marland Kitchen Filed Weights   09/06/22 1136  Weight: 50.4 kg    Family History  Problem Relation Age of Onset   Cancer Mother    Stroke Father    Cancer Father     Lung cancer Father    Hypertension Father    Leukemia Maternal Grandmother    Depression Other    Bipolar disorder Other    Aneurysm Neg Hx      Atrial Fibrillation Management history:  Previous antiarrhythmic drugs: none Previous cardioversions: none Previous ablations: 08/09/22 CHADS2VASC score: 2 Anticoagulation history: Eliquis   Past Medical History:  Diagnosis Date   A-fib ([place])    Asthma    GERD (gastroesophageal reflux disease)    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[place])    Past Surgical History:  Procedure Laterality Date   ATRIAL FIBRILLATION ABLATION N/A 08/09/2022   Procedure: ATRIAL FIBRILLATION ABLATION;  Surgeon: Melida Quitter, MD;  Location: [place] [facility];  Service: Cardiovascular;  Laterality: N/A;   CESAREAN SECTION     IR RADIOLOGIST EVAL & MGMT  04/21/2021   IR RADIOLOGIST EVAL & MGMT  08/18/2021   IR RADIOLOGIST EVAL & MGMT  04/11/2022   IR RADIOLOGIST EVAL & MGMT  08/04/2022    Current Outpatient Medications  Medication Sig Dispense Refill   albuterol (VENTOLIN HFA) 108 (90 Base) MCG/ACT inhaler Inhale 2 puffs into the lungs every 6 (six) hours as needed for wheezing or shortness of breath.     apixaban (ELIQUIS) 5 MG TABS tablet Take 1 tablet (5 mg total) by mouth 2 (two) times daily. 180 tablet 1   pantoprazole (PROTONIX) 40 MG tablet Take 1 tablet (40 mg total) by mouth daily. 45 tablet 0   colchicine 0.6 MG tablet Take 1  tablet (0.6 mg total) by mouth 2 (two) times daily for 3 days. 6 tablet 0   No current facility-administered medications for this encounter.    Allergies  Allergen Reactions   Bee Venom Anaphylaxis   Codeine Swelling   Tilactase Diarrhea   Latex Rash    Social History   Socioeconomic History   Marital status: Widowed    Spouse name: Not on file   Number of children: Not on file   Years of education: Not on file   Highest education level: Not on file  Occupational History   Not on file  Tobacco Use   Smoking status: Never    Smokeless tobacco: Never   Tobacco comments:    Never smoke 09/06/22  Substance and Sexual Activity   Alcohol use: Never   Drug use: Never   Sexual activity: Not on file  Other Topics Concern   Not on file  Social History Narrative   Not on file   Social Determinants of Health   Financial Resource Strain: Not on file  Food Insecurity: Not on file  Transportation Needs: Not on file  Physical Activity: Not on file  Stress: Not on file  Social Connections: Not on file  Intimate Partner Violence: Not on file     ROS- All systems are reviewed and negative except as per the HPI above.  Physical Exam: Vitals:   09/06/22 1136  BP: 108/74  Pulse: 76  Weight: 50.4 kg  Height: 5' 4"$  (1.626 m)    GEN- The patient is a well appearing female, alert and oriented x 3 today.   Head- normocephalic, atraumatic Eyes-  Sclera clear, conjunctiva pink Ears- hearing intact Oropharynx- clear Neck- supple  Lungs- Clear to ausculation bilaterally, normal work of breathing Heart- Regular rate and rhythm, no murmurs, rubs or gallops  GI- soft, NT, ND, + BS Extremities- no clubbing, cyanosis, or edema MS- no significant deformity or atrophy Skin- no rash or lesion Psych- euthymic mood, full affect Neuro- strength and sensation are intact  Wt Readings from Last 3 Encounters:  09/06/22 50.4 kg  08/09/22 49.9 kg  07/25/22 53.3 kg    EKG today demonstrates  SR Vent. rate 76 BPM PR interval 126 ms QRS duration 82 ms QT/QTcB 384/432 ms  Echo 05/24/22 demonstrated  1. No pericardial effusion.   2. Left ventricular ejection fraction, by estimation, is 55 to 60%. Left  ventricular ejection fraction by 3D volume is 59 %. The left ventricle has  normal function. The left ventricle has no regional wall motion  abnormalities. Left ventricular diastolic  parameters were normal. The average left ventricular global longitudinal strain is -23.3 %. The global longitudinal strain is normal.    3. Right ventricular systolic function is normal. The right ventricular  size is normal. There is normal pulmonary artery systolic pressure. The  estimated right ventricular systolic pressure is [id number] mmHg.   4. The mitral valve is grossly normal. Trivial mitral valve  regurgitation. No evidence of mitral stenosis.   5. The aortic valve is tricuspid. Aortic valve regurgitation is mild. No  aortic stenosis is present.   6. The inferior vena cava is dilated in size with >50% respiratory  variability, suggesting right atrial pressure of 8 mmHg.   Epic records are reviewed at length today  CHA2DS2-VASc Score = 2  The patient's score is based upon: CHF History: 0 HTN History: 0 Diabetes History: 0 Stroke History: 0 Vascular Disease History: 0 Age Score: 1 Gender Score:  1       ASSESSMENT AND PLAN: 1. Paroxysmal Atrial Fibrillation (ICD10:  I48.0) The patient's CHA2DS2-VASc score is 2, indicating a 2.2% annual risk of stroke.   S/p afib ablation 08/09/22 Reassured patient that episodes of afib are not unusual post ablation. Encouraged that the episodes are improving.  Continue Eliquis 5 mg BID with no missed doses for 3 months post ablation. She has a visit with Dr Burt Knack to discuss Watchman given her elevated bleeding risk with splenic artery aneurysm. However, with a CV score of 2 it may be reasonable to just discontinue Eliquis once she has healed from ablation.    Follow up with Dr Burt Knack and Dr Myles Gip as scheduled.    [facility] [address] [place], [place] [address] [phone] 09/06/2022 11:44 AM

## 2022-09-11 ENCOUNTER — Encounter: Payer: Self-pay | Admitting: Cardiovascular Disease

## 2022-09-11 ENCOUNTER — Ambulatory Visit: Payer: Medicare Other | Attending: Cardiovascular Disease | Admitting: Cardiovascular Disease

## 2022-09-11 VITALS — BP 110/70 | HR 76 | Ht 64.0 in | Wt 114.6 lb

## 2022-09-11 DIAGNOSIS — I480 Paroxysmal atrial fibrillation: Secondary | ICD-10-CM | POA: Diagnosis present

## 2022-09-11 NOTE — Progress Notes (Signed)
Watchman Consult Note   Date:  09/15/2022   ID:  Braylen Dudas, DOB Apr 11, 1951, MRN [id number]  PCP:  Leana Roe, MD  Cardiologist:  Dr Johney Frame Primary Electrophysiologist: Dr Myles Gip Referring Physician: Adline Peals, PA   CC: to discuss Watchman implant    History of Present Illness: Knoelle Woller is a 72 y.o. female referred by Dr Myles Gip for evaluation of atrial fibrillation and stroke prevention. She has paroxysmal atrial fibrillation.  The patient has been evaluated by their referring physician and is felt to be a poor candidate for long term Pulpotio Bareas due to concerns about bleeding risk.  She therefore presents today for Watchman evaluation.   The patient is here alone today.  She was evaluated for heart palpitations and paroxysmal atrial fibrillation in November 2023.  She was noted to be highly symptomatic with episodes of atrial fibrillation.  She underwent A-fib ablation August 09, 2022.  She has been noted to have a splenic artery aneurysm and there is some concern about being on chronic oral anticoagulation in this setting.  Today, she denies symptoms of palpitations, chest pain, shortness of breath, orthopnea, PND, lower extremity edema, claudication, dizziness, presyncope, syncope, bleeding, or neurologic sequela. The patient is tolerating medications without difficulties and is otherwise without complaint today.    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[place])    Past Surgical History:  Procedure Laterality Date   ATRIAL FIBRILLATION ABLATION N/A 08/09/2022   Procedure: ATRIAL FIBRILLATION ABLATION;  Surgeon: Melida Quitter, MD;  Location: [place] [facility];  Service: Cardiovascular;  Laterality: N/A;   CESAREAN SECTION     IR RADIOLOGIST EVAL & MGMT  04/21/2021   IR RADIOLOGIST EVAL & MGMT  08/18/2021   IR RADIOLOGIST EVAL & MGMT  04/11/2022   IR RADIOLOGIST EVAL & MGMT   08/04/2022     Current Outpatient Medications  Medication Sig Dispense Refill   albuterol (VENTOLIN HFA) 108 (90 Base) MCG/ACT inhaler Inhale 2 puffs into the lungs every 6 (six) hours as needed for wheezing or shortness of breath.     apixaban (ELIQUIS) 5 MG TABS tablet Take 1 tablet (5 mg total) by mouth 2 (two) times daily. 180 tablet 1   No current facility-administered medications for this visit.    Allergies:   Bee venom, Codeine, Tilactase, and Latex   Social History:  The patient  reports that she has never smoked. She has never used smokeless tobacco. She reports that she does not drink alcohol and does not use drugs.   Family History:  The patient's  family history includes Bipolar disorder in an other family member; Cancer in her father and mother; Depression in an other family member; Hypertension in her father; Leukemia in her maternal grandmother; Lung cancer in her father; Stroke in her father.    ROS:  Please see the history of present illness.   All other systems are reviewed and negative.    PHYSICAL EXAM: VS:  BP 110/70   Pulse 76   Ht '5\' 4"'$  (1.626 m)   Wt 114 lb 9.6 oz (52 kg)   SpO2 97%   BMI 19.67 kg/m  , BMI Body mass index is 19.67 kg/m. GEN: Well nourished, well developed, in no acute distress  HEENT: normal  Neck: no JVD, carotid bruits, or masses Cardiac: RRR; no murmurs, rubs, or gallops,no edema  Respiratory:  clear to auscultation bilaterally, normal work of breathing GI: soft, nontender, nondistended, + BS MS: no deformity or atrophy  Skin: warm and dry  Neuro:  Strength and sensation are intact Psych: euthymic mood, full affect  EKG:  EKG is ordered today. The ekg ordered today shows NSR, 76 bpm, rightward axis, septal infarct age-undetermined, otherwise normal   Recent Labs: 07/25/2022: BUN 12; Creatinine, Ser 0.73; Hemoglobin 13.1; Platelets 295; Potassium 4.6; Sodium 139    Lipid Panel  No results found for: "CHOL", "TRIG", "HDL",  "CHOLHDL", "VLDL", "LDLCALC", "LDLDIRECT"   Wt Readings from Last 3 Encounters:  09/11/22 114 lb 9.6 oz (52 kg)  09/06/22 111 lb 3.2 oz (50.4 kg)  08/09/22 110 lb (49.9 kg)      Other studies Reviewed: Additional studies/ records that were reviewed today include:  CTA Abdomen 04/05/2022: IMPRESSION: VASCULAR   There is 2.5 cm aneurysm in the splenic artery in the splenic hilum. Dense calcifications are noted in the wall. No interval changes are noted in the size of aneurysm. There is no extravasation of contrast. Other major vascular structures in the abdomen are unremarkable.   NON-VASCULAR   Hepatic and left renal cysts. Small pericardial effusion. Other findings as described in the body of the report.     ASSESSMENT AND PLAN:  1.  Paroxysmal atrial fibrillation I have seen Salome Salber is a 72 y.o. female in the office today who has been referred for a Watchman left atrial appendage closure device.  She has a history of paroxysmal atrial fibrillation.  The patients atrial fibrillation-related risk scores are outlined below:   CHA2DS2-VASc Score = 2   This indicates a 2.2% annual risk of stroke. The patient's score is based upon: CHF History: 0 HTN History: 0 Diabetes History: 0 Stroke History: 0 Vascular Disease History: 0 Age Score: 1 Gender Score: 1       HAS-BLED score = 1 Hypertension No  Abnormal renal and liver function (Dialysis, transplant, Cr >2.26 mg/dL /Cirrhosis or Bilirubin >2x Normal or AST/ALT/AP >3x Normal) No  Stroke No  Bleeding No  Labile INR (Unstable/high INR) No  Elderly (>65) Yes  Drugs or alcohol (? 8 drinks/week, anti-plt or NSAID) No   The patient may have an increased risk of bleeding on long-term anticoagulation secondary to the presence of a splenic artery aneurysm.  As an alternative to lon-term oral anticoagulation, a device strategy with left atrial appendage occlusion is an appropriate consideration.  We discussed the  rationale for watchman implantation/left atrial appendage occlusion as a way to avoid long-term anticoagulation.  The patient expressed from the onset of our encounter today that she is really not interested in having another procedure done.  She is more inclined to discontinue anticoagulation after she discusses this further with her Dr Johney Frame or with Dr. Myles Gip.  The patient's CHADS2 Vascor is 2 based on female gender and age alone.  Oral anticoagulation is to a recommendation in the setting rather than a class I recommendation.  I suspect she will decide to discontinue anticoagulation after she has a shared decision-making conversation with her regular cardiology providers.  Again, she is not interested in pursuing watchman implantation and I advised her that I would be happy to see her back in the future if she develops increased stroke risk or changes her mind about this therapy.    Current medicines are reviewed at length with the patient today.   The patient does not have concerns regarding her medicines.  The  following changes were made today:  none  Orders Placed This Encounter  Procedures   EKG 12-Lead     Signed, Sherren Mocha, MD 09/15/2022  11:45 AM     [facility] [address] [place] [address] [phone] (office) [phone] (fax)

## 2022-10-17 ENCOUNTER — Other Ambulatory Visit (HOSPITAL_COMMUNITY): Payer: Self-pay

## 2022-11-07 ENCOUNTER — Ambulatory Visit: Payer: Medicare Other | Admitting: Cardiovascular Disease

## 2022-12-05 ENCOUNTER — Ambulatory Visit: Payer: Medicare Other | Admitting: Cardiovascular Disease

## 2023-02-13 ENCOUNTER — Telehealth: Payer: Self-pay | Admitting: Cardiovascular Disease

## 2023-02-13 NOTE — Telephone Encounter (Signed)
Pt is requesting an EP provider switch from Dr. Nelly Laurence to Dr. Lalla Brothers due to her being closer to [facility].

## 2023-03-12 ENCOUNTER — Telehealth: Payer: Self-pay | Admitting: Cardiovascular Disease

## 2023-03-12 NOTE — Telephone Encounter (Signed)
Seems reasonable.  However, patient is following up for A-fib ablation which is managed by EP and the A-fib clinic.  Seems like it would be reasonable to move the patient's appointment over to [facility] for EP follow-up of A-fib ablation.

## 2023-03-12 NOTE — Telephone Encounter (Signed)
Patient states she has a family history  of heart attacks and cardiac issues. She would like to know if she needs to wear a monitor before coming in for her next appointment, 9/19 with Dunn, PA.

## 2023-03-13 ENCOUNTER — Ambulatory Visit: Payer: Medicare Other | Admitting: Cardiovascular Disease

## 2023-03-14 NOTE — Progress Notes (Unsigned)
Cardiology Office Note Date:  03/14/2023  Patient ID:  Samantha Roy, Samantha Roy 1951/04/23, MRN [id number] PCP:  Elyse Hsu, MD  Cardiologist:  [name], MD Electrophysiologist: Dr. Nelly Laurence > Lanier Prude, MD (though has not met)  ***refresh   Chief Complaint: afib follow-up  History of Present Illness: Samantha Roy is a 72 y.o. female with PMH notable for AFib, GERD, splenic artery aneurysm; seen today for Lanier Prude, MD for routine electrophysiology followup.  She is s/p AF ablatuion 07/2022 with Dr. Nelly Laurence. She continued to have brief AF episodes after ablation, but duration was becoming shorter. She was eval'd by Dr. Excell Seltzer 08/2022 for watchman, though patient was not interested in procedure and wanted to stop OAC.   On follow-up today,  *** AF burden, symptoms *** palpitations *** bleeding concerns     Since last being seen in our clinic the patient reports doing ***.  she denies chest pain, palpitations, dyspnea, PND, orthopnea, nausea, vomiting, dizziness, syncope, edema, weight gain, or early satiety.     Device Information: ***  AAD History: none  Past Medical History:  Diagnosis Date   A-fib (HCC)    Asthma    GERD (gastroesophageal reflux disease)    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HCC)     Past Surgical History:  Procedure Laterality Date   ATRIAL FIBRILLATION ABLATION N/A 08/09/2022   Procedure: ATRIAL FIBRILLATION ABLATION;  Surgeon: Maurice Small, MD;  [facility];  Service: Cardiovascular;  Laterality: N/A;   CESAREAN SECTION     IR RADIOLOGIST EVAL & MGMT  04/21/2021   IR RADIOLOGIST EVAL & MGMT  08/18/2021   IR RADIOLOGIST EVAL & MGMT  04/11/2022   IR RADIOLOGIST EVAL & MGMT  08/04/2022    Current Outpatient Medications  Medication Instructions   albuterol (VENTOLIN HFA) 108 (90 Base) MCG/ACT inhaler 2 puffs, Inhalation, Every 6 hours PRN   apixaban (ELIQUIS) 5 mg, Oral, 2 times daily    Social History:  The  patient  reports that she has never smoked. She has never used smokeless tobacco. She reports that she does not drink alcohol and does not use drugs.   Family History:  *** include only if pertinent The patient's family history includes Bipolar disorder in an other family member; Cancer in her father and mother; Depression in an other family member; Hypertension in her father; Leukemia in her maternal grandmother; Lung cancer in her father; Stroke in her father.***  ROS:  Please see the history of present illness. All other systems are reviewed and otherwise negative.   PHYSICAL EXAM: *** VS:  There were no vitals taken for this visit. BMI: There is no height or weight on file to calculate BMI.  GEN- The patient is well appearing, alert and oriented x 3 today.   Lungs- Clear to ausculation bilaterally, normal work of breathing.  Heart- {Blank single:19197::"Regular","Irregularly irregular"} rate and rhythm, no murmurs, rubs or gallops Extremities- {EDEMA LEVEL:28147::"No"} peripheral edema, warm, dry   EKG is ordered. Personal review of EKG from today shows:  ***        Recent Labs: 07/25/2022: BUN 12; Creatinine, Ser 0.73; Hemoglobin 13.1; Platelets 295; Potassium 4.6; Sodium 139  No results found for requested labs within last 365 days.   CrCl cannot be calculated (Patient's most recent lab result is older than the maximum 21 days allowed.).   Wt Readings from Last 3 Encounters:  09/11/22 114 lb 9.6 oz (52 kg)  09/06/22 111 lb 3.2  oz (50.4 kg)  08/09/22 110 lb (49.9 kg)     Additional studies reviewed include: Previous EP, cardiology notes.   Af ablation, 08/09/2022 1. Pulmonary vein isolation (wide antral circumferential lesion set) 2. Esophageal temperature monitoring using the Circa temperature monitor 3. Three-dimensional electroanatomic mapping of atrial fibrillation 4. Intracardiac echocardiography 5. Transseptal puncture of an intact septum. 6. Comprehensive EP study    TTE, 05/24/2022 1. No pericardial effusion.   2. Left ventricular ejection fraction, by estimation, is 55 to 60%. Left ventricular ejection fraction by 3D volume is 59 %. The left ventricle has normal function. The left ventricle has no regional wall motion abnormalities. Left ventricular diastolic  parameters were normal. The average left ventricular global longitudinal strain is -23.3 %. The global longitudinal strain is normal.   3. Right ventricular systolic function is normal. The right ventricular size is normal. There is normal pulmonary artery systolic pressure. The estimated right ventricular systolic pressure is 26.5 mmHg.   4. The mitral valve is grossly normal. Trivial mitral valve regurgitation. No evidence of mitral stenosis.   5. The aortic valve is tricuspid. Aortic valve regurgitation is mild. No aortic stenosis is present.   6. The inferior vena cava is dilated in size with >50% respiratory variability, suggesting right atrial pressure of 8 mmHg.   Long term monitor, 07/21/2021 (CE from [employer]) 1)  Technical quality sufficient for interpretation. Diagnostic monitored time was 91% of study duration.  2)  Manually detected events corresponded to sinus mechanism rhythms with and without PVCs.  3)  Automatically detected events corresponded to atrial fibrillation with PVCs.    ASSESSMENT AND PLAN:  #) parox AFib  #) Hypercoag d/t parox afib CHA2DS2-VASc Score = 2 [CHF History: 0, HTN History: 0, Diabetes History: 0, Stroke History: 0, Vascular Disease History: 0, Age Score: 1, Gender Score: 1].  Therefore, the patient's annual risk of stroke is 2.2 %.     {Confirm score is correct.  If not, click here to update score.  REFRESH note.  :1}   Stroke ppx - 5mg  eliquis BID, appropriately dosed No bleeding concerns    #) ***   {Are you ordering a CV Procedure (e.g. stress test, cath, DCCV, TEE, etc)?   Press F2        :[id number]}   Current medicines are reviewed at length  with the patient today.   The patient {ACTIONS; HAS/DOES NOT HAVE:19233} concerns regarding her medicines.  The following changes were made today:  {NONE DEFAULTED:18576}  Labs/ tests ordered today include: *** No orders of the defined types were placed in this encounter.    Disposition: Follow up with {EPMDS:28135} or EP APP {EPFOLLOW UP:28173}   Signed, Sherie Don, NP  03/14/23  3:58 PM  Electrophysiology [facility]

## 2023-03-14 NOTE — Telephone Encounter (Signed)
LMOV to r/s

## 2023-03-15 ENCOUNTER — Encounter: Payer: Self-pay | Admitting: Cardiology

## 2023-03-15 ENCOUNTER — Ambulatory Visit: Payer: Medicare Other | Attending: Cardiovascular Disease | Admitting: Cardiology

## 2023-03-15 ENCOUNTER — Ambulatory Visit (INDEPENDENT_AMBULATORY_CARE_PROVIDER_SITE_OTHER): Payer: Medicare Other

## 2023-03-15 VITALS — BP 120/70 | HR 65 | Ht 64.0 in | Wt 116.1 lb

## 2023-03-15 DIAGNOSIS — I480 Paroxysmal atrial fibrillation: Secondary | ICD-10-CM | POA: Insufficient documentation

## 2023-03-15 DIAGNOSIS — D6869 Other thrombophilia: Secondary | ICD-10-CM | POA: Diagnosis not present

## 2023-03-15 NOTE — Patient Instructions (Addendum)
Medication Instructions:  The current medical regimen is effective;  continue present plan and medications.  *If you need a refill on your cardiac medications before your next appointment, please call your pharmacy*   Testing/Procedures:  ZIO XT- Long Term Monitor Instructions  Your physician has requested you wear a ZIO patch monitor for 14 days.  This is a single patch monitor. [employer] supplies one patch monitor per enrollment. Additional stickers are not available. Please do not apply patch if you will be having a Nuclear Stress Test,  Echocardiogram, Cardiac CT, MRI, or Chest Xray during the period you would be wearing the  monitor. The patch cannot be worn during these tests. You cannot remove and re-apply the  ZIO XT patch monitor.  Your ZIO patch monitor will be mailed 3 day USPS to your address on file. It may take 3-5 days  to receive your monitor after you have been enrolled.  Once you have received your monitor, please review the enclosed instructions. Your monitor  has already been registered assigning a specific monitor serial # to you.  Billing and Patient Assistance Program Information  We have supplied [employer] with any of your insurance information on file for billing purposes. [employer] offers a sliding scale Patient Assistance Program for patients that do not have  insurance, or whose insurance does not completely cover the cost of the ZIO monitor.  You must apply for the Patient Assistance Program to qualify for this discounted rate.  To apply, please call [employer] at [phone], select option 4, select option 2, ask to apply for  Patient Assistance Program. Meredeth Ide will ask your household income, and how many people  are in your household. They will quote your out-of-pocket cost based on that information.  [employer] will also be able to set up a 105-month, interest-free payment plan if needed.  Applying the monitor   Shave hair from upper left chest.  Hold abrader  disc by orange tab. Rub abrader in 40 strokes over the upper left chest as  indicated in your monitor instructions.  Clean area with 4 enclosed alcohol pads. Let dry.  Apply patch as indicated in monitor instructions. Patch will be placed under collarbone on left  side of chest with arrow pointing upward.  Rub patch adhesive wings for 2 minutes. Remove white label marked "1". Remove the white  label marked "2". Rub patch adhesive wings for 2 additional minutes.  While looking in a mirror, press and release button in center of patch. A small green light will  flash 3-4 times. This will be your only indicator that the monitor has been turned on.  Do not shower for the first 24 hours. You may shower after the first 24 hours.  Press the button if you feel a symptom. You will hear a small click. Record Date, Time and  Symptom in the Patient Logbook.  When you are ready to remove the patch, follow instructions on the last 2 pages of Patient  Logbook. Stick patch monitor onto the last page of Patient Logbook.  Place Patient Logbook in the blue and white box. Use locking tab on box and tape box closed  securely. The blue and white box has prepaid postage on it. Please place it in the mailbox as  soon as possible. Your physician should have your test results approximately 7 days after the  monitor has been mailed back to [facility].  Call [facility] Customer Care at [phone] if you have questions regarding  your ZIO  XT patch monitor. Call them immediately if you see an orange light blinking on your  monitor.  If your monitor falls off in less than 4 days, contact our Monitor department at [phone].  If your monitor becomes loose or falls off after 4 days call [employer] at [phone] for  suggestions on securing your monitor    Follow-Up: At [facility], you and your health needs are our priority.  As part of our continuing mission to provide you with exceptional heart  care, we have created designated Provider Care Teams.  These Care Teams include your primary Cardiologist (physician) and Advanced Practice Providers (APPs -  Physician Assistants and Nurse Practitioners) who all work together to provide you with the care you need, when you need it.  We recommend signing up for the patient portal called "MyChart".  Sign up information is provided on this After Visit Summary.  MyChart is used to connect with patients for Virtual Visits (Telemedicine).  Patients are able to view lab/test results, encounter notes, upcoming appointments, etc.  Non-urgent messages can be sent to your provider as well.   To learn more about what you can do with MyChart, go to [email or web address].    Your next appointment:   We will call you with a follow up visit.  Provider:   Dr.Lambert  6 months back with Sherie Don, NP

## 2023-03-16 ENCOUNTER — Telehealth: Payer: Self-pay

## 2023-03-16 NOTE — Telephone Encounter (Signed)
Left message for patient to call back.  Needs to be scheduled for a loop implant with Dr. Lalla Brothers after she wears her heart monitor.

## 2023-03-26 DIAGNOSIS — I480 Paroxysmal atrial fibrillation: Secondary | ICD-10-CM | POA: Diagnosis not present

## 2023-04-06 ENCOUNTER — Other Ambulatory Visit: Payer: Self-pay | Admitting: Interventional Radiology

## 2023-04-06 DIAGNOSIS — I728 Aneurysm of other specified arteries: Secondary | ICD-10-CM

## 2023-04-12 ENCOUNTER — Ambulatory Visit: Payer: Medicare Other | Admitting: Physician Assistant

## 2023-04-23 ENCOUNTER — Ambulatory Visit
Admission: RE | Admit: 2023-04-23 | Discharge: 2023-04-23 | Disposition: A | Discharge status: Home / Self Care | Payer: Medicare Other | Source: Ambulatory Visit | Attending: Interventional Radiology | Admitting: Interventional Radiology

## 2023-04-23 DIAGNOSIS — I728 Aneurysm of other specified arteries: Secondary | ICD-10-CM

## 2023-04-23 MED ORDER — IOPAMIDOL (ISOVUE-370) INJECTION 76%
75.0000 mL | Freq: Once | INTRAVENOUS | Status: AC | PRN
  Administered 2023-04-23: 75 mL via INTRAVENOUS

## 2023-04-27 ENCOUNTER — Ambulatory Visit
Admission: RE | Admit: 2023-04-27 | Discharge: 2023-04-27 | Disposition: A | Discharge status: Home / Self Care | Payer: Medicare Other | Source: Ambulatory Visit | Attending: Interventional Radiology | Admitting: Interventional Radiology

## 2023-04-27 DIAGNOSIS — I728 Aneurysm of other specified arteries: Secondary | ICD-10-CM

## 2023-04-27 HISTORY — PX: IR RADIOLOGIST EVAL & MGMT: IMG5224

## 2023-04-27 NOTE — Progress Notes (Signed)
Chief Complaint: Splenic Artery Aneurysm   Referring Physician(s): Dr. Maryfrances Bunnell, [facility] Cardiology: Dr. Shari Prows, Dr. Nelly Laurence     History of Present Illness: Samantha Roy is a 72 y.o. female presenting today as a scheduled appointment to [facility]IR.    Prior History:    We met Samantha Roy during admission at [facility], when she was referred for evaluation of abdominal pain in the setting of a splenic artery aneurysm discovered on CTA.     On her ED admission, CTA followed a routine contrast CT, showing a rim calcified splenic hilar aneurysm ~59mm.  No inflammation or fluid.  No evidence of hemorrhage.    She was observed during the admission and DC'd 02/23/21.    We saw her in follow up 04/21/2021, and she was doing fine.     She has started her cardiology care with Dr. Shari Prows, specifically regarding diagnosis of afib.    Interval History:  Today Samantha Roy is doing fine, denying any symptoms of abdominal or flank pain.  She has no interval problems related to her aneurysm.    She does tell me that she recently had foot surgery with podiatry, with some sort of infection of her toe occurring after a hiking trip in the [facility].  She tells me it "took a while to figure out" but now that she has had surgery, she is improving.  She denies symptoms of claudication when hiking.   She also has had cardiac ablation performed January 2024.  She is now off her [facility], and continues to follow up with Cardiology.  She says another appointment next week.   I have independently reviewed her CTA scan of the abdomen, with unchanged appearance of the splenic artery aneurysm.     I also reviewed her most recent CBC, BMP, and Hgb A1C, particularly in the setting of her foot infection.  Hbg A1C is borderline, 5.8. I reviewed cardiology notes, and the most recent EKG, which shows sinus rhythm.    Past Medical History:  Diagnosis Date   A-fib (HCC)    Asthma    GERD (gastroesophageal reflux disease)     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HCC)     Past Surgical History:  Procedure Laterality Date   ATRIAL FIBRILLATION ABLATION N/A 08/09/2022   Procedure: ATRIAL FIBRILLATION ABLATION;  Surgeon: Maurice Small, MD;  [facility];  Service: Cardiovascular;  Laterality: N/A;   CESAREAN SECTION     IR RADIOLOGIST EVAL & MGMT  04/21/2021   IR RADIOLOGIST EVAL & MGMT  08/18/2021   IR RADIOLOGIST EVAL & MGMT  04/11/2022   IR RADIOLOGIST EVAL & MGMT  08/04/2022    Allergies: Bee venom, Codeine, Eliquis [apixaban], Hydrocodone bit-homatrop mbr, Molds & smuts, Tilactase, and Latex  Medications: Prior to Admission medications   Medication Sig Start Date End Date Taking? Authorizing Provider  albuterol (VENTOLIN HFA) 108 (90 Base) MCG/ACT inhaler Inhale 2 puffs into the lungs every 6 (six) hours as needed for wheezing or shortness of breath.    [provider]     Family History  Problem Relation Age of Onset   Cancer Mother    Stroke Father    Cancer Father    Lung cancer Father    Hypertension Father    Hypertension Sister    Heart disease Sister 42   Heart attack Brother 57   Leukemia Maternal Grandmother    Depression Other    Bipolar disorder Other    Aneurysm Neg Hx  Social History   Socioeconomic History   Marital status: Widowed    Spouse name: Not on file   Number of children: Not on file   Years of education: Not on file   Highest education level: Not on file  Occupational History   Not on file  Tobacco Use   Smoking status: Never   Smokeless tobacco: Never   Tobacco comments:    Never smoke 09/06/22  Vaping Use   Vaping status: Never Used  Substance and Sexual Activity   Alcohol use: Never   Drug use: Never   Sexual activity: Not on file  Other Topics Concern   Not on file  Social History Narrative   Not on file   Social Determinants of Health   Financial Resource Strain: Not on file  Food Insecurity: Not on file  Transportation Needs: Not  on file  Physical Activity: Not on file  Stress: Not on file  Social Connections: Not on file      Review of Systems: A 12 point ROS discussed and pertinent positives are indicated in the HPI above.  All other systems are negative.  Review of Systems  Vital Signs: There were no vitals taken for this visit.  Advance Care Plan: The advanced care plan/surrogate decision maker was discussed at the time of visit and documented in the medical record.    Physical Exam  Mallampati Score:     Imaging: LONG TERM MONITOR (3-14 DAYS)  Result Date: 04/15/2023 HR 50 - 184, average 74 bpm. 1 NSVT lasting 5 beats. 14 nonsustained SVT, longest 9 beats. Rare supraventricular and ventricular ectopy. No sustained arrhythmias. No atrial fibrillation. Samantha Lang T. Lalla Brothers, MD, [facility], [facility] Cardiac Electrophysiology    Labs:  CBC: Recent Labs    07/25/22 0858  WBC 5.3  HGB 13.1  HCT 40.0  PLT 295    COAGS: No results for input(s): "INR", "APTT" in the last 8760 hours.  BMP: Recent Labs    07/25/22 0858  NA 139  K 4.6  CL 101  CO2 26  GLUCOSE 94  BUN 12  CALCIUM 9.4  CREATININE 0.73    LIVER FUNCTION TESTS: No results for input(s): "BILITOT", "AST", "ALT", "ALKPHOS", "PROT", "ALBUMIN" in the last 8760 hours.  TUMOR MARKERS: No results for input(s): "AFPTM", "CEA", "CA199", "CHROMGRNA" in the last 8760 hours.     Assessment and Plan:   Samantha Roy is very pleasant 72 yo female with history of splenic artery aneurysm, low risk, active surveillance strategy  (Stable acute illness)   She also has been treated for a foot infection, which she says "took awhile to diagnose", and she is status post surgery with ortho.   She has been treated fro afib with ablation, now off AC, and sinus rhythm on prior EKG. , She continues to follow with cardiology.    We discussed the active surveillance strategy to continue with the splenic artery aneurysm.  We also discussed the circumstances  with her foot infection.  While she does not have symptoms of classic vascular claudication, she does have a history of foot infection, the risk factors of aortic athero on the CT, risk factor of age, and borderline HgbA1c.  I think it would be low risk and good practice for her to get an ABI.  Given so simple, I suggest that she gets that done nearby her home. She understands.    Plan: - follow up office visit in ~12 months with repeat CTA abdomen - continue medical  care - I suggest she get an ABI/non-invasive exam, given her foot infection.  This can be done local to her home.   Today I spent 40 minutes in review of imaging, notes, cardiology notes, previous labs, getting history from the patient, previous tests/EKG, counselling, ordering future tests/CTA, documenting in the record..   Electronically Signed: Gilmer Mor 04/27/2023, 8:32 AM   I spent a total of    40 Minutes in face to face in clinical consultation, greater than 50% of which was counseling/coordinating care for surveillance of splenic artery aneurysm, review of CV risk factors, counsel on ABI in setting of foot infection.

## 2023-05-01 NOTE — Progress Notes (Unsigned)
  Electrophysiology Office Follow up Visit Note:    Date:  05/02/2023   ID:  Samantha Roy, DOB [birth year], MRN [id number]  PCP:  Elyse Hsu, MD  [facility] Cardiologist:  [name], MD  [facility] Electrophysiologist:  Lanier Prude, MD    Interval History:    Samantha Roy is a 72 y.o. female who presents for a follow up visit.   Last seen March 15, 2023 by Samantha Roy.  The patient has been seen by Dr. Nelly Laurence in the past and underwent atrial fibrillation ablation in January 2024.  She saw Dr. Excell Seltzer in February of this year for possible left atrial appendage occlusion but decided to not pursue this procedure.  When she saw Samantha Roy recently, she described palpitations that were different than her previous atrial fibrillation episodes.  She stopped her Eliquis 3 months after the ablation.  She prefers to avoid long-term exposure to anticoagulation if at all possible.  A 2-week ZIO monitor was ordered at the appoint with Samantha Roy.  She recently went hiking in [place] and now has a foot infection.  She is receiving care with orthopedics.  The wound is open.     Past medical, surgical, social and family history were reviewed.  ROS:   Please see the history of present illness.    All other systems reviewed and are negative.  EKGs/Labs/Other Studies Reviewed:    The following studies were reviewed today:  April 15, 2023 ZIO monitor personally reviewed No atrial fibrillation       Physical Exam:    VS:  BP 120/80 (BP Location: Left Arm, Patient Position: Sitting, Cuff Size: Normal)   Pulse 64   Ht 5\' 4"  (1.626 m)   Wt 118 lb 6 oz (53.7 kg)   SpO2 99%   BMI 20.32 kg/m     Wt Readings from Last 3 Encounters:  05/02/23 118 lb 6 oz (53.7 kg)  03/15/23 116 lb 2 oz (52.7 kg)  09/11/22 114 lb 9.6 oz (52 kg)     GEN:  Well nourished, well developed in no acute distress CARDIAC: RRR, no murmurs, rubs, gallops RESPIRATORY:  Clear to auscultation  without rales, wheezing or rhonchi       ASSESSMENT:    1. Paroxysmal atrial fibrillation (HCC)    PLAN:    In order of problems listed above:  #History of atrial fibrillation Post ablation August 09, 2022 by Dr. Nelly Laurence.  She is self stopped her Eliquis 3 months after the ablation procedure.  She has not had a known recurrence but does experience some palpitations that feel different than her prior A-fib episodes.  ZIO monitor has been unrevealing.  She presents today for loop recorder implant.  I discussed the loop recorder procedure in detail with the patient including the risks and monthly monitoring costs and she wishes to proceed.  Given the open wound on her foot, we will hold off on implanting the loop recorder today.  She will call us once the foot wound has healed and the infection cleared and we will get the loop recorder implanted at that time.  I will put a backup appointment in for 3 months from now with me.  Follow-up in 3 months with me or sooner if the wound heals.   Signed, Steffanie Dunn, MD, [facility], [facility] 05/02/2023 12:06 PM    Electrophysiology [facility]

## 2023-05-02 ENCOUNTER — Ambulatory Visit: Payer: Medicare Other | Attending: Cardiology | Admitting: Cardiology

## 2023-05-02 ENCOUNTER — Encounter: Payer: Self-pay | Admitting: Cardiology

## 2023-05-02 VITALS — BP 120/80 | HR 64 | Ht 64.0 in | Wt 118.4 lb

## 2023-05-02 DIAGNOSIS — I480 Paroxysmal atrial fibrillation: Secondary | ICD-10-CM | POA: Insufficient documentation

## 2023-05-02 NOTE — Patient Instructions (Signed)
Medication Instructions:  Your physician recommends that you continue on your current medications as directed. Please refer to the Current Medication list given to you today.  *If you need a refill on your cardiac medications before your next appointment, please call your pharmacy*  Follow-Up: At [facility], you and your health needs are our priority.  As part of our continuing mission to provide you with exceptional heart care, we have created designated Provider Care Teams.  These Care Teams include your primary Cardiologist (physician) and Advanced Practice Providers (APPs -  Physician Assistants and Nurse Practitioners) who all work together to provide you with the care you need, when you need it.  Your next appointment:   3 month(s)  Provider:   Lars Mage, MD

## 2023-05-02 NOTE — Addendum Note (Signed)
Addended by: Steffanie Dunn T on: 05/02/2023 12:07 PM   Modules accepted: Level of Service

## 2023-06-24 NOTE — Progress Notes (Unsigned)
Electrophysiology Office Follow up Visit Note:    Date:  06/25/2023   ID:  Samantha Roy, DOB 1951-02-04, MRN [id number]  PCP:  Elyse Hsu, MD  [facility] Cardiologist:  [name], MD (Inactive)  [facility] Electrophysiologist:  Lanier Prude, MD    Interval History:     Samantha Roy is a 72 y.o. female who presents for a follow up visit.  I last saw the patient May 02, 2023.  She had previously had an ablation for atrial fibrillation by Dr. Nelly Laurence.  She follows with Dr. Excell Seltzer.  She saw Rosalita Chessman in clinic and reported palpitations that were different from her prior atrial fibrillation episodes.  She is hoping to avoid exposure to anticoagulation long-term if at all possible.  At the last appointment she had a foot infection that was still healing and so we delayed implanting a loop recorder until today. Her foot wound has healed.  Today she shows me a picture of her legs where the RLE was a darker purple/red than the normal appearing LLE. She tells me this occurs intermittently and lasts for minutes - hours. It does not stay like that. She has never met a vascular doctor.   Discussed the use of AI scribe software for clinical note transcription with the patient, who gave verbal consent to proceed.  History of Present Illness   The patient presents with intermittent leg discoloration and discomfort, which she describes as feeling 'funny.' The discoloration and discomfort are not constant, but when she experiences them, they last for less than a few hours. The patient's primary care provider had previously ordered a CT scan to evaluate for possible circulatory issues, but the scan was never completed. The patient also has a known latex allergy, which manifests as a rash when she comes into contact with latex products.            Past medical, surgical, social and family history were reviewed.  ROS:   Please see the history of present illness.    All  other systems reviewed and are negative.  EKGs/Labs/Other Studies Reviewed:    The following studies were reviewed today:          Physical Exam:    VS:  BP 110/68 (BP Location: Left Arm, Patient Position: Sitting, Cuff Size: Normal)   Pulse 86   Ht 5\' 4"  (1.626 m)   Wt 121 lb (54.9 kg)   SpO2 97%   BMI 20.77 kg/m     Wt Readings from Last 3 Encounters:  06/25/23 121 lb (54.9 kg)  05/02/23 118 lb 6 oz (53.7 kg)  03/15/23 116 lb 2 oz (52.7 kg)     Physical Exam   GENERAL: Well appearing, no distress. CHEST: Lungs clear to auscultation. Regular rate and rhythm, no increased work of breathing. CARDIOVASCULAR: Heart rate regular, no murmurs.          ASSESSMENT:    1. Paroxysmal atrial fibrillation (HCC)   2. Palpitations    PLAN:    In order of problems listed above:  Assessment and Plan    Intermittent leg discoloration   Possible vascular issue. The patient reports intermittent leg discoloration lasting less than a few hours. No associated pain reported.   -Refer to vascular specialist for further evaluation.      #Paroxysmal atrial fibrillation #Palpitations With some residual palpitations after atrial fibrillation ablation.  She was previously on Eliquis but stopped this 3 months after her catheter ablation given no known  recurrence of the arrhythmia.  Today we again discussed loop recorder monitoring for surveillance of her palpitations.  I discussed the procedure and monthly monitoring costs associated with loop implant.  She would like to proceed today.  Follow-up 1 year with APP.      Signed, Steffanie Dunn, MD, [facility], [facility] 06/25/2023 1:27 PM    Electrophysiology [facility]  ----------------------  SURGEON:  Lanier Prude, MD     PREPROCEDURE DIAGNOSIS:  Atrial fibrillation    POSTPROCEDURE DIAGNOSIS: Atrial fibrillation     PROCEDURES:   1. Implantable loop recorder implantation    INTRODUCTION:  Samantha Roy presents with a history of atrial fibrillation The costs of loop recorder monitoring have been discussed with the patient.    DESCRIPTION OF PROCEDURE:  Informed written consent was obtained.  A preprocedural timeout was performed with the RN Wenda Low). The patient required no sedation for the procedure today.  Mapping over the patient's chest was performed to identify the area where electrograms were most prominent for ILR recording.  This area was found to be the left parasternal region over the 4th intercostal space. The patients left chest was therefore prepped and draped in the usual sterile fashion. The skin overlying the left parasternal region was infiltrated with lidocaine for local analgesia.  A 0.5-cm incision was made over the left parasternal region over the 3rd intercostal space.  A subcutaneous ILR pocket was fashioned using a combination of sharp and blunt dissection.  A Medtronic Reveal LINQ [id number]) implantable loop recorder was then placed into the pocket  R waves were very prominent and measured >0.74mV.  Steri- Strips and a sterile dressing were then applied.  There were no early apparent complications.     CONCLUSIONS:   1. Successful implantation of a implantable loop recorder for Atrial fibrillation  2. No early apparent complications.   Sheria Lang T. Lalla Brothers, MD, [facility], [facility] Cardiac Electrophysiology

## 2023-06-25 ENCOUNTER — Ambulatory Visit: Payer: Medicare Other | Attending: Cardiology | Admitting: Cardiology

## 2023-06-25 ENCOUNTER — Encounter: Payer: Self-pay | Admitting: Cardiology

## 2023-06-25 VITALS — BP 110/68 | HR 86 | Ht 64.0 in | Wt 121.0 lb

## 2023-06-25 DIAGNOSIS — R002 Palpitations: Secondary | ICD-10-CM | POA: Insufficient documentation

## 2023-06-25 DIAGNOSIS — I480 Paroxysmal atrial fibrillation: Secondary | ICD-10-CM | POA: Insufficient documentation

## 2023-06-25 NOTE — Patient Instructions (Addendum)
Medication Instructions:  Your physician recommends that you continue on your current medications as directed. Please refer to the Current Medication list given to you today.  Labwork: None ordered.  Testing/Procedures: None ordered.  Follow-Up:  Your physician wants you to follow-up in: one year with an APP.  You will receive a reminder letter in the mail two months in advance. If you don't receive a letter, please call our office to schedule the follow-up appointment.    Implantable Loop Recorder Placement, Care After This sheet gives you information about how to care for yourself after your procedure. Your health care provider may also give you more specific instructions. If you have problems or questions, contact your health care provider. What can I expect after the procedure? After the procedure, it is common to have: Soreness or discomfort near the incision. Some swelling or bruising near the incision.  Follow these instructions at home: Incision care  Monitor your cardiac device site for redness, swelling, and drainage. Call the device clinic at [phone] if you experience these symptoms or fever/chills.  Keep the large square bandage on your site for 24 hours and then you may remove it yourself. Keep the steri-strips underneath in place.   You may shower after 72 hours / 3 days from your procedure with the steri-strips in place. They will usually fall off on their own, or may be removed after 10 days. Pat dry.   Avoid lotions, ointments, or perfumes over your incision until it is well-healed.  Please do not submerge in water until your site is completely healed.   Your device is MRI compatible.   Remote monitoring is used to monitor your cardiac device from home. This monitoring is scheduled every month by our office. It allows [place] to keep an eye on the function of your device to ensure it is working properly.  If your wound site starts to bleed apply pressure.     For help with the monitor please call Medtronic Monitor Support Specialist directly at [phone].    If you have any questions/concerns please call the device clinic at [phone].  Activity  Return to your normal activities.  General instructions Follow instructions from your health care provider about how to manage your implantable loop recorder and transmit the information. Learn how to activate a recording if this is necessary for your type of device. You may go through a metal detection gate, and you may let someone hold a metal detector over your chest. Show your ID card if needed. Do not have an MRI unless you check with your health care provider first. Take over-the-counter and prescription medicines only as told by your health care provider. Keep all follow-up visits as told by your health care provider. This is important. Contact a health care provider if: You have redness, swelling, or pain around your incision. You have a fever. You have pain that is not relieved by your pain medicine. You have triggered your device because of fainting (syncope) or because of a heartbeat that feels like it is racing, slow, fluttering, or skipping (palpitations). Get help right away if you have: Chest pain. Difficulty breathing. Summary After the procedure, it is common to have soreness or discomfort near the incision. Change your dressing as told by your health care provider. Follow instructions from your health care provider about how to manage your implantable loop recorder and transmit the information. Keep all follow-up visits as told by your health care provider. This is important. This information  is not intended to replace advice given to you by your health care provider. Make sure you discuss any questions you have with your health care provider. Document Released: 06/21/2015 Document Revised: 08/25/2017 Document Reviewed: 08/25/2017 Elsevier Patient Education  2020 [employer].

## 2023-06-27 ENCOUNTER — Telehealth: Payer: Self-pay | Admitting: Cardiology

## 2023-06-27 NOTE — Telephone Encounter (Signed)
   Patient Name: Samantha Roy  DOB: 1951/04/03 MRN: [id number]  Primary Cardiologist: [name], MD (Inactive)  Chart reviewed as part of pre-operative protocol coverage. She was recently seen by Dr. Lalla Brothers on 06/25/2023 and had a loop recorder placed to help monitor paroxysmal atrial fibrillation after ablation in 07/2022. Cataract extractions are recognized in guidelines as low risk surgeries that do not typically require specific preoperative testing or holding of blood thinner therapy. Therefore, given past medical history and time since last visit, based on ACC/AHA guidelines, Claramae Mccay would be at acceptable risk for the planned procedure without further cardiovascular testing.   I will route this recommendation to the requesting party via Epic fax function and remove from pre-op pool.  Please call with questions.  Corrin Parker, PA-C 06/27/2023, 9:10 AM

## 2023-06-27 NOTE — Telephone Encounter (Signed)
   Pre-operative Risk Assessment    Patient Name: Samantha Roy  DOB: November 16, 1950 MRN: [id number]      Request for Surgical Clearance    Procedure:  Cataract Removal   Date of Surgery:  Clearance 07/11/23 & 08/08/23                                 Surgeon:  Leota Sauers Surgeon's Group or Practice Name:  [facility] Phone number:  [phone] Fax number:  [phone]   Type of Clearance Requested:   - Medical  Heart monitor inserted 06/25/23   Type of Anesthesia:  Not Indicated   Additional requests/questions:    Signed, Narda Amber   06/27/2023, 8:50 AM

## 2023-06-28 ENCOUNTER — Telehealth: Payer: Self-pay

## 2023-06-28 NOTE — Telephone Encounter (Signed)
  Loop Recorder Follow up   Is patient connected to Carelink/Latitude? No   Have steri-strips fallen off or been removed? No   Does the patient need in office follow up? No   Please continue to monitor your cardiac device site for redness, swelling, and drainage. Call the device clinic at [phone] if you experience these symptoms, fever/chills, or have questions about your device.   Remote monitoring is used to monitor your cardiac device from home. This monitoring is scheduled every month by our office. It allows [place] to keep an eye on the functioning of your device to ensure it is working properly.   Pt is not home yet to connect to the monitor. Will check 06/29/2023. Pt had questions about outer bandage and not being able to take it off. Pt call has been transferred to [facility]

## 2023-07-02 NOTE — Telephone Encounter (Signed)
Transmission received 07/02/2023.

## 2023-07-03 ENCOUNTER — Encounter: Payer: Self-pay | Admitting: Ophthalmology

## 2023-07-04 NOTE — Anesthesia Preprocedure Evaluation (Addendum)
Anesthesia Evaluation  Patient identified by MRN, date of birth, ID band Patient awake    Reviewed: Allergy & Precautions, H&P , NPO status , Patient's Chart, lab work & pertinent test results  Airway Mallampati: II  TM Distance: >3 FB Neck ROM: Full    Dental no notable dental hx.    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    NO PRIOR STUDY FOR COMPARISON    3D acquisition and reconstructions were performed as part of this    examination to more accurately quantify the effects of identified    structural abnormalities as part of the exam. (post-processing on an    Independent workstation).   06-25-23 office visit:  reported palpitations that were different from her prior atrial fibrillation episode   Neuro/Psych negative neurological ROS  negative psych ROS   GI/Hepatic negative GI ROS, Neg liver ROS,,,  Endo/Other  negative endocrine ROS    Renal/GU negative Renal ROS  negative genitourinary   Musculoskeletal negative musculoskeletal ROS (+)    Abdominal   Peds negative pediatric ROS (+)  Hematology negative hematology ROS (+)   Anesthesia Other Findings Asthma  Cardiovascular: (+) dysrhythmias (Afib s/p ablation, saw cardiology in August) and PVD (Hx of splenic artery aneurysm)   States PONV after ablation, and her mouth was sore and "chewed up." Patient  thinks she may have "chewed up her mouth" when awakening from anesthesia.   Lovely nice lady, very anxious.    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 No evidence of leak or impending rupture. Marked tortuosity of  the involved branch of the splenic artery is noted.    Reproductive/Obstetrics negative OB ROS                              Anesthesia Physical Anesthesia Plan  ASA: 3  Anesthesia Plan: MAC   Post-op Pain Management:    Induction: Intravenous  PONV Risk Score and Plan:   Airway Management Planned: Natural Airway and Nasal Cannula  Additional Equipment:   Intra-op Plan:   Post-operative Plan:   Informed Consent: I have reviewed the patients History and Physical, chart, labs and discussed the procedure including the risks, benefits and alternatives for the proposed anesthesia with the patient or authorized representative who has indicated his/her understanding and acceptance.     Dental Advisory Given  Plan Discussed with: Anesthesiologist, CRNA and Surgeon  Anesthesia Plan Comments: (Patient consented for risks of anesthesia including but not limited to:  - adverse reactions to medications - damage to eyes, teeth, lips or other oral mucosa - nerve damage due to positioning  - sore throat or hoarseness - Damage to heart, brain, nerves, lungs, other parts of body or loss of life  Patient voiced understanding and assent.)        Anesthesia Quick Evaluation

## 2023-07-09 NOTE — Discharge Instructions (Signed)

## 2023-07-10 ENCOUNTER — Encounter: Payer: Self-pay | Admitting: Ophthalmology

## 2023-07-11 ENCOUNTER — Ambulatory Visit: Payer: Medicare Other | Admitting: Anesthesiology

## 2023-07-11 ENCOUNTER — Encounter
Admission: RE | Disposition: A | Discharge status: Home / Self Care | Payer: Self-pay | Source: Home / Self Care | Attending: Ophthalmology

## 2023-07-11 ENCOUNTER — Other Ambulatory Visit: Payer: Self-pay

## 2023-07-11 ENCOUNTER — Encounter: Payer: Self-pay | Admitting: Ophthalmology

## 2023-07-11 ENCOUNTER — Ambulatory Visit
Admission: RE | Admit: 2023-07-11 | Discharge: 2023-07-11 | Disposition: A | Discharge status: Home / Self Care | Payer: Medicare Other | Attending: Ophthalmology | Admitting: Ophthalmology

## 2023-07-11 DIAGNOSIS — I4891 Unspecified atrial fibrillation: Secondary | ICD-10-CM | POA: Diagnosis not present

## 2023-07-11 DIAGNOSIS — H2511 Age-related nuclear cataract, right eye: Secondary | ICD-10-CM | POA: Insufficient documentation

## 2023-07-11 HISTORY — DX: Other complications of anesthesia, initial encounter: T88.59XA

## 2023-07-11 HISTORY — DX: Fatty (change of) liver, not elsewhere classified: K76.0

## 2023-07-11 HISTORY — DX: Other specified anxiety disorders: F41.8

## 2023-07-11 HISTORY — DX: Other specified postprocedural states: Z98.890

## 2023-07-11 HISTORY — PX: CATARACT EXTRACTION W/PHACO: SHX586

## 2023-07-11 HISTORY — DX: Family history of other specified conditions: Z84.89

## 2023-07-11 SURGERY — PHACOEMULSIFICATION, CATARACT, WITH IOL INSERTION
Anesthesia: Monitor Anesthesia Care | Laterality: Right

## 2023-07-11 MED ORDER — DEXMEDETOMIDINE HCL IN NACL 80 MCG/20ML IV SOLN
INTRAVENOUS | Status: AC
  Filled 2023-07-11: qty 20

## 2023-07-11 MED ORDER — CEFUROXIME OPHTHALMIC INJECTION 1 MG/0.1 ML
INJECTION | OPHTHALMIC | Status: DC | PRN
  Administered 2023-07-11: 1 mg via INTRACAMERAL

## 2023-07-11 MED ORDER — SIGHTPATH DOSE#1 BSS IO SOLN
INTRAOCULAR | Status: DC | PRN
  Administered 2023-07-11: 2 mL

## 2023-07-11 MED ORDER — FENTANYL CITRATE (PF) 100 MCG/2ML IJ SOLN
INTRAMUSCULAR | Status: AC
  Filled 2023-07-11: qty 2

## 2023-07-11 MED ORDER — MIDAZOLAM HCL 2 MG/2ML IJ SOLN
INTRAMUSCULAR | Status: AC
Start: 2023-07-11 — End: ?
  Filled 2023-07-11: qty 2

## 2023-07-11 MED ORDER — ARMC OPHTHALMIC DILATING DROPS
1.0000 | OPHTHALMIC | Status: DC | PRN
Start: 2023-07-11 — End: 2023-07-11
  Administered 2023-07-11 (×3): 1 via OPHTHALMIC

## 2023-07-11 MED ORDER — SIGHTPATH DOSE#1 BSS IO SOLN
INTRAOCULAR | Status: DC | PRN
  Administered 2023-07-11: 71 mL via OPHTHALMIC

## 2023-07-11 MED ORDER — TETRACAINE HCL 0.5 % OP SOLN
1.0000 [drp] | OPHTHALMIC | Status: DC | PRN
Start: 2023-07-11 — End: 2023-07-11
  Administered 2023-07-11 (×3): 1 [drp] via OPHTHALMIC

## 2023-07-11 MED ORDER — SIGHTPATH DOSE#1 NA HYALUR & NA CHOND-NA HYALUR IO KIT
PACK | INTRAOCULAR | Status: DC | PRN
  Administered 2023-07-11: 1 via OPHTHALMIC

## 2023-07-11 MED ORDER — BRIMONIDINE TARTRATE-TIMOLOL 0.2-0.5 % OP SOLN
OPHTHALMIC | Status: DC | PRN
  Administered 2023-07-11: 1 [drp] via OPHTHALMIC

## 2023-07-11 MED ORDER — MIDAZOLAM HCL 2 MG/2ML IJ SOLN
INTRAMUSCULAR | Status: DC | PRN
  Administered 2023-07-11: 2 mg via INTRAVENOUS

## 2023-07-11 MED ORDER — ONDANSETRON HCL 4 MG/2ML IJ SOLN
4.0000 mg | Freq: Once | INTRAMUSCULAR | Status: AC
  Administered 2023-07-11: 4 mg via INTRAVENOUS

## 2023-07-11 MED ORDER — DEXMEDETOMIDINE HCL IN NACL 200 MCG/50ML IV SOLN
INTRAVENOUS | Status: DC | PRN
  Administered 2023-07-11 (×2): 4 ug via INTRAVENOUS

## 2023-07-11 MED ORDER — SIGHTPATH DOSE#1 BSS IO SOLN
INTRAOCULAR | Status: DC | PRN
  Administered 2023-07-11: 15 mL via INTRAOCULAR

## 2023-07-11 MED ORDER — FENTANYL CITRATE (PF) 100 MCG/2ML IJ SOLN
INTRAMUSCULAR | Status: DC | PRN
  Administered 2023-07-11 (×2): 50 ug via INTRAVENOUS

## 2023-07-11 MED ORDER — ONDANSETRON HCL 4 MG/2ML IJ SOLN
INTRAMUSCULAR | Status: AC
  Filled 2023-07-11: qty 2

## 2023-07-11 SURGICAL SUPPLY — 9 items
CATARACT SUITE SIGHTPATH (MISCELLANEOUS) ×1
FEE CATARACT SUITE SIGHTPATH (MISCELLANEOUS) ×1 IMPLANT
GLOVE SRG 8 PF TXTR STRL LF DI (GLOVE) ×1 IMPLANT
GLOVE SURG ENC TEXT LTX SZ7.5 (GLOVE) ×1 IMPLANT
LENS CLRN  VIVITY TORIC 24.0 ×1 IMPLANT
LENS IOL CLRN VT TRC 5 24.0 IMPLANT
NDL FILTER BLUNT 18X1 1/2 (NEEDLE) ×1 IMPLANT
NEEDLE FILTER BLUNT 18X1 1/2 (NEEDLE) ×1
SYR 3ML LL SCALE MARK (SYRINGE) ×1 IMPLANT

## 2023-07-11 NOTE — Transfer of Care (Signed)
Immediate Anesthesia Transfer of Care Note  Patient: Samantha Roy  Procedure(s) Performed: CATARACT EXTRACTION PHACO AND INTRAOCULAR LENS PLACEMENT (IOC) RIGHT  CLAREON VIVITY TORIC 19.67 01:48.3 (Right)  Patient Location: PACU  Anesthesia Type: MAC  Level of Consciousness: awake, alert  and patient cooperative  Airway and Oxygen Therapy: Patient Spontanous Breathing and Patient connected to supplemental oxygen  Post-op Assessment: Post-op Vital signs reviewed, Patient's Cardiovascular Status Stable, Respiratory Function Stable, Patent Airway and No signs of Nausea or vomiting  Post-op Vital Signs: Reviewed and stable  Complications: No notable events documented.

## 2023-07-11 NOTE — Anesthesia Postprocedure Evaluation (Signed)
Anesthesia Post Note  Patient: Lynlie Lacroix  Procedure(s) Performed: CATARACT EXTRACTION PHACO AND INTRAOCULAR LENS PLACEMENT (IOC) RIGHT  CLAREON VIVITY TORIC 19.67 01:48.3 (Right)  Patient location during evaluation: PACU Anesthesia Type: MAC Level of consciousness: awake and alert Pain management: pain level controlled Vital Signs Assessment: post-procedure vital signs reviewed and stable Respiratory status: spontaneous breathing, nonlabored ventilation, respiratory function stable and patient connected to nasal cannula oxygen Cardiovascular status: stable and blood pressure returned to baseline Postop Assessment: no apparent nausea or vomiting Anesthetic complications: no   No notable events documented.   Last Vitals:  Vitals:   07/11/23 0930 07/11/23 0935  BP: 115/79 104/71  Pulse: 65 61  Resp: 11 19  Temp: (!) 36.2 C (!) 36.2 C  SpO2: 99% 97%    Last Pain:  Vitals:   07/11/23 0935  TempSrc:   PainSc: 0-No pain                 Azazel Franze C Jaymarion Trombly

## 2023-07-11 NOTE — Op Note (Signed)
LOCATION:  [facility]   PREOPERATIVE DIAGNOSIS:  Nuclear sclerotic cataract of the right eye.  H25.11   POSTOPERATIVE DIAGNOSIS:  Nuclear sclerotic cataract of the right eye.   PROCEDURE:  Phacoemulsification with Toric posterior chamber intraocular lens placement of the right eye.  Ultrasound time: Procedure(s): CATARACT EXTRACTION PHACO AND INTRAOCULAR LENS PLACEMENT (IOC) RIGHT  CLAREON VIVITY TORIC 19.67 01:48.3 (Right)  LENS:   Implant Name Type Inv. Item Serial No. Manufacturer Lot No. LRB No. Used Action  LENS CLRN  VIVITY TORIC 24.0 - [id number]  LENS CLRN  VIVITY TORIC 24.0 [id number] SIGHT[employer]  Right 1 Implanted     [id number]  Toric intraocular lens with 3.0 diopters of cylindrical power with axis orientation at 1 degrees.   SURGEON:  Deirdre Evener, MD   ANESTHESIA: Topical with tetracaine drops and 2% Xylocaine jelly, augmented with 1% preservative-free intracameral lidocaine. .   COMPLICATIONS:  None.   DESCRIPTION OF PROCEDURE:  The patient was identified in the holding room and transported to the operating suite and placed in the supine position under the operating microscope.  The right eye was identified as the operative eye, and it was prepped and draped in the usual sterile ophthalmic fashion.    A clear-corneal paracentesis incision was made at the 12:00 position.  0.5 ml of preservative-free 1% lidocaine was injected into the anterior chamber. The anterior chamber was filled with Viscoat.  A 2.4 millimeter near clear corneal incision was then made at the 9:00 position.  A cystotome and capsulorrhexis forceps were then used to make a curvilinear capsulorrhexis.  Hydrodissection and hydrodelineation were then performed using balanced salt solution.   Phacoemulsification was then used in stop and chop fashion to remove the lens, nucleus and epinucleus.  The remaining cortex was aspirated using the irrigation and aspiration handpiece.  Provisc  viscoelastic was then placed into the capsular bag to distend it for lens placement.  The Verion digital marker was used to align the implant at the intended axis.   A Toric lens was then injected into the capsular bag.  It was rotated clockwise until the axis marks on the lens were approximately 15 degrees in the counterclockwise direction to the intended alignment.  The viscoelastic was aspirated from the eye using the irrigation aspiration handpiece.  Then, a Koch spatula through the sideport incision was used to rotate the lens in a clockwise direction until the axis markings of the intraocular lens were lined up with the Verion alignment.  Balanced salt solution was then used to hydrate the wounds. Cefuroxime 0.1 ml of a 10mg /ml solution was injected into the anterior chamber for a dose of 1 mg of intracameral antibiotic at the completion of the case.    The eye was noted to have a physiologic pressure and there was no wound leak noted.   Timolol and Brimonidine drops were applied to the eye.  The patient was taken to the recovery room in stable condition having had no complications of anesthesia or surgery.  Angelus Hoopes 07/11/2023, 9:28 AM

## 2023-07-11 NOTE — H&P (Signed)
[facility]   Primary Care Physician:  Elyse Hsu, MD Ophthalmologist: Dr. Lockie Mola  Pre-Procedure History & Physical: HPI:  Samantha Roy is a 72 y.o. female here for ophthalmic surgery.   Past Medical History:  Diagnosis Date   A-fib (HCC)    Asthma    Complication of anesthesia    Pt reports aggitation, severe headaches and chewed lips until they bled during/after a fib ablation   Family history of adverse reaction to anesthesia    Mother - wakes during procedures   Fatty liver    GERD (gastroesophageal reflux disease)    PONV (postoperative nausea and vomiting)    S/P ablation of atrial fibrillation    Situational anxiety    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HCC)    splenic    Past Surgical History:  Procedure Laterality Date   ATRIAL FIBRILLATION ABLATION N/A 08/09/2022   Procedure: ATRIAL FIBRILLATION ABLATION;  Surgeon: Maurice Small, MD;  [facility]: [facility];  Service: Cardiovascular;  Laterality: N/A;   CESAREAN SECTION     IR RADIOLOGIST EVAL & MGMT  04/21/2021   IR RADIOLOGIST EVAL & MGMT  08/18/2021   IR RADIOLOGIST EVAL & MGMT  04/11/2022   IR RADIOLOGIST EVAL & MGMT  08/04/2022   IR RADIOLOGIST EVAL & MGMT  04/27/2023    Prior to Admission medications   Medication Sig Start Date End Date Taking? Authorizing Provider  albuterol (VENTOLIN HFA) 108 (90 Base) MCG/ACT inhaler Inhale 2 puffs into the lungs every 6 (six) hours as needed for wheezing or shortness of breath. Patient not taking: Reported on 07/03/2023    [provider]    Allergies as of 06/11/2023 - Review Complete 05/02/2023  Allergen Reaction Noted   Bee venom Anaphylaxis 02/03/2020   Codeine Swelling 02/22/2021   Eliquis [apixaban] Other (See Comments) 03/15/2023   Hydrocodone bit-homatrop mbr Other (See Comments) 02/27/2023   Molds & smuts Other (See Comments) 02/27/2023   Tilactase Diarrhea 09/06/2022   Latex Rash 01/04/2020    Family History  Problem  Relation Age of Onset   Cancer Mother    Stroke Father    Cancer Father    Lung cancer Father    Hypertension Father    Hypertension Sister    Heart disease Sister 48   Heart attack Brother 60   Leukemia Maternal Grandmother    Depression Other    Bipolar disorder Other    Aneurysm Neg Hx     Social History   Socioeconomic History   Marital status: Widowed    Spouse name: Not on file   Number of children: Not on file   Years of education: Not on file   Highest education level: Not on file  Occupational History   Not on file  Tobacco Use   Smoking status: Never   Smokeless tobacco: Never   Tobacco comments:    Never smoke 09/06/22  Vaping Use   Vaping status: Never Used  Substance and Sexual Activity   Alcohol use: Never   Drug use: Never   Sexual activity: Not on file  Other Topics Concern   Not on file  Social History Narrative   Not on file   Social Drivers of Health   Financial Resource Strain: Low Risk  (06/07/2023)   Received from [facility]   Overall Financial Resource Strain (CARDIA)    Difficulty of Paying Living Expenses: Not hard at all  Food Insecurity: No Food Insecurity (07/04/2023)  Received from [facility]   Hunger Vital Sign    Worried About Running Out of Food in the Last Year: Never true    Ran Out of Food in the Last Year: Never true  Transportation Needs: No Transportation Needs (06/07/2023)   Received from [facility] - Transportation    In the past 12 months, has lack of transportation kept you from medical appointments or from getting medications?: No    Lack of Transportation (Non-Medical): No  Physical Activity: Sufficiently Active (07/04/2023)   Received from [facility]   Exercise Vital Sign    Days of Exercise per Week: 7 days    Minutes of Exercise per Session: 30 min  Stress: Stress Concern Present (07/04/2023)   Received from [facility] of Occupational Health - Occupational Stress Questionnaire    Feeling of Stress : Very much  Social Connections: Moderately Integrated (07/04/2023)   Received from [facility]   Social Connection and Isolation Panel [NHANES]    Frequency of Communication with Friends and Family: More than three times a week    Frequency of Social Gatherings with Friends and Family: More than three times a week    Attends Religious Services: More than 4 times per year    Active Member of [employer] or Organizations: Yes    Attends Banker Meetings: More than 4 times per year    Marital Status: Widowed  Catering manager Violence: Not on file    Review of Systems: See HPI, otherwise negative ROS  Physical Exam: BP 131/80   Temp 98.7 F (37.1 C) (Temporal)   Resp 20   Ht 5' 4.02" (1.626 m)   Wt 53.4 kg   SpO2 99%   BMI 20.21 kg/m  General:   Alert,  pleasant and cooperative in NAD Head:  Normocephalic and atraumatic. Lungs:  Clear to auscultation.    Heart:  Regular rate and rhythm.   Impression/Plan: Samantha Roy is here for ophthalmic surgery.  Risks, benefits, limitations, and alternatives regarding ophthalmic surgery have been reviewed with the patient.  Questions have been answered.  All parties agreeable.   Lockie Mola, MD  07/11/2023, 9:02 AM

## 2023-07-12 ENCOUNTER — Encounter: Payer: Self-pay | Admitting: Ophthalmology

## 2023-07-23 NOTE — Anesthesia Preprocedure Evaluation (Signed)
Anesthesia Evaluation    Airway Mallampati: II  TM Distance: >3 FB     Dental no notable dental hx.    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    NO PRIOR STUDY FOR COMPARISON    3D acquisition and reconstructions were performed as part of this    examination to more accurately quantify the effects of identified    structural abnormalities as part of the exam. (post-processing on an    Independent workstation).    06-25-23 office visit:  reported palpitations that were different from her prior atrial fibrillation episode      Neuro/Psych    GI/Hepatic   Endo/Other    Renal/GU      Musculoskeletal   Abdominal   Peds  Hematology   Anesthesia Other Findings Previous cataract surgery 07-11-23 Dr. Juel Burrow anesthesiologist and Emeterio Reeve, CRNA, patient received versed 2 mg IV, fentanyl 100 mcg IV, and precedex 8 mcg IV  Reproductive/Obstetrics                              Anesthesia Physical Anesthesia Plan  ASA: 3  Anesthesia Plan: MAC   Post-op Pain Management:    Induction: Intravenous  PONV Risk Score and Plan:   Airway Management Planned: Natural Airway and Nasal Cannula  Additional Equipment:   Intra-op Plan:   Post-operative Plan:   Informed Consent: I have reviewed the patients History and Physical, chart, labs and discussed the procedure including the risks, benefits and alternatives for the proposed anesthesia with the patient or authorized representative who has indicated his/her understanding and acceptance.     Dental Advisory Given  Plan  Discussed with: Anesthesiologist, CRNA and Surgeon  Anesthesia Plan Comments: (Patient consented for risks of anesthesia including but not limited to:  - adverse reactions to medications - damage to eyes, teeth, lips or other oral mucosa - nerve damage due to positioning  - sore throat or hoarseness - Damage to heart, brain, nerves, lungs, other parts of body or loss of life  Patient voiced understanding and assent.)         Anesthesia Quick Evaluation

## 2023-07-27 ENCOUNTER — Telehealth: Payer: Self-pay | Admitting: Cardiology

## 2023-07-27 NOTE — Telephone Encounter (Signed)
 The patient was calling with concerns about her loop recorder implant. She had a loop recorder placed on 12/2. She stated that she feels like the loop recorder is working its way to the surface. She denies any pain or redness except for a small place where she feels like it may be coming out. She denies a fever, temperature change or drainage. She would like to come in on Monday to have someone look at it.

## 2023-07-27 NOTE — Telephone Encounter (Signed)
 Left a message for the patient to call to add her on Monday at 1 with Suzann.   Sherie Don, NP  You; Skip Mayer, RNJust now (4:22 PM)    OK to add her to Monday

## 2023-07-27 NOTE — Telephone Encounter (Signed)
 Patient is calling to talk with Sherie Don or nurse

## 2023-07-27 NOTE — Telephone Encounter (Signed)
 The patient is unable to come Monday but can come Tuesday at 10 am (per Raj Janus, RN). Appointment has been made

## 2023-07-30 NOTE — Discharge Instructions (Signed)

## 2023-07-31 ENCOUNTER — Ambulatory Visit: Payer: Medicare Other | Attending: Cardiovascular Disease

## 2023-07-31 DIAGNOSIS — I480 Paroxysmal atrial fibrillation: Secondary | ICD-10-CM | POA: Insufficient documentation

## 2023-07-31 NOTE — Progress Notes (Signed)
 Loop seems to have travelled slightly. No concern at this time. Pt endorses some discomfort but it is tolerable. Advised to call device clinic if this changes.

## 2023-08-01 ENCOUNTER — Encounter: Payer: Self-pay | Admitting: Ophthalmology

## 2023-08-01 ENCOUNTER — Ambulatory Visit: Payer: Medicare Other | Admitting: Anesthesiology

## 2023-08-01 ENCOUNTER — Ambulatory Visit
Admission: RE | Admit: 2023-08-01 | Discharge: 2023-08-01 | Disposition: A | Discharge status: Home / Self Care | Payer: Medicare Other | Attending: Ophthalmology | Admitting: Ophthalmology

## 2023-08-01 ENCOUNTER — Encounter
Admission: RE | Disposition: A | Discharge status: Home / Self Care | Payer: Self-pay | Source: Home / Self Care | Attending: Ophthalmology

## 2023-08-01 ENCOUNTER — Other Ambulatory Visit: Payer: Self-pay

## 2023-08-01 DIAGNOSIS — I4891 Unspecified atrial fibrillation: Secondary | ICD-10-CM | POA: Diagnosis not present

## 2023-08-01 DIAGNOSIS — H2512 Age-related nuclear cataract, left eye: Secondary | ICD-10-CM | POA: Diagnosis present

## 2023-08-01 DIAGNOSIS — J45909 Unspecified asthma, uncomplicated: Secondary | ICD-10-CM | POA: Diagnosis not present

## 2023-08-01 DIAGNOSIS — K219 Gastro-esophageal reflux disease without esophagitis: Secondary | ICD-10-CM | POA: Insufficient documentation

## 2023-08-01 HISTORY — PX: CATARACT EXTRACTION W/PHACO: SHX586

## 2023-08-01 SURGERY — PHACOEMULSIFICATION, CATARACT, WITH IOL INSERTION
Anesthesia: Monitor Anesthesia Care | Laterality: Left

## 2023-08-01 MED ORDER — MIDAZOLAM HCL 2 MG/2ML IJ SOLN
INTRAMUSCULAR | Status: DC | PRN
  Administered 2023-08-01 (×2): 1 mg via INTRAVENOUS

## 2023-08-01 MED ORDER — ONDANSETRON HCL 4 MG/2ML IJ SOLN
INTRAMUSCULAR | Status: AC
  Filled 2023-08-01: qty 2

## 2023-08-01 MED ORDER — CEFUROXIME OPHTHALMIC INJECTION 1 MG/0.1 ML
INJECTION | OPHTHALMIC | Status: DC | PRN
  Administered 2023-08-01: .1 mL via INTRACAMERAL

## 2023-08-01 MED ORDER — FENTANYL CITRATE (PF) 100 MCG/2ML IJ SOLN
INTRAMUSCULAR | Status: DC | PRN
  Administered 2023-08-01: 50 ug via INTRAVENOUS

## 2023-08-01 MED ORDER — ARMC OPHTHALMIC DILATING DROPS
OPHTHALMIC | Status: AC
  Filled 2023-08-01: qty 0.5

## 2023-08-01 MED ORDER — SIGHTPATH DOSE#1 BSS IO SOLN
INTRAOCULAR | Status: DC | PRN
  Administered 2023-08-01: 1 mL

## 2023-08-01 MED ORDER — ARMC OPHTHALMIC DILATING DROPS
1.0000 | OPHTHALMIC | Status: DC | PRN
  Administered 2023-08-01 (×3): 1 via OPHTHALMIC

## 2023-08-01 MED ORDER — MIDAZOLAM HCL 2 MG/2ML IJ SOLN
1.0000 mg | INTRAMUSCULAR | Status: DC | PRN
  Administered 2023-08-01: 1 mg via INTRAVENOUS

## 2023-08-01 MED ORDER — FENTANYL CITRATE (PF) 100 MCG/2ML IJ SOLN
INTRAMUSCULAR | Status: AC
  Filled 2023-08-01: qty 2

## 2023-08-01 MED ORDER — BRIMONIDINE TARTRATE-TIMOLOL 0.2-0.5 % OP SOLN
OPHTHALMIC | Status: DC | PRN
  Administered 2023-08-01: 1 [drp] via OPHTHALMIC

## 2023-08-01 MED ORDER — SIGHTPATH DOSE#1 BSS IO SOLN
INTRAOCULAR | Status: DC | PRN
  Administered 2023-08-01: 70 mL via OPHTHALMIC

## 2023-08-01 MED ORDER — TETRACAINE HCL 0.5 % OP SOLN
OPHTHALMIC | Status: AC
  Filled 2023-08-01: qty 4

## 2023-08-01 MED ORDER — SIGHTPATH DOSE#1 BSS IO SOLN
INTRAOCULAR | Status: DC | PRN
  Administered 2023-08-01: 15 mL via INTRAOCULAR

## 2023-08-01 MED ORDER — SIGHTPATH DOSE#1 NA HYALUR & NA CHOND-NA HYALUR IO KIT
PACK | INTRAOCULAR | Status: DC | PRN
  Administered 2023-08-01: 1 via OPHTHALMIC

## 2023-08-01 MED ORDER — ONDANSETRON HCL 4 MG/2ML IJ SOLN
INTRAMUSCULAR | Status: DC | PRN
  Administered 2023-08-01: 4 mg via INTRAVENOUS

## 2023-08-01 MED ORDER — MIDAZOLAM HCL 2 MG/2ML IJ SOLN
INTRAMUSCULAR | Status: AC
Start: 2023-08-01 — End: ?
  Filled 2023-08-01: qty 2

## 2023-08-01 MED ORDER — MIDAZOLAM HCL 2 MG/2ML IJ SOLN
INTRAMUSCULAR | Status: AC
  Filled 2023-08-01: qty 2

## 2023-08-01 MED ORDER — TETRACAINE HCL 0.5 % OP SOLN
1.0000 [drp] | OPHTHALMIC | Status: DC | PRN
Start: 2023-08-01 — End: 2023-08-01
  Administered 2023-08-01 (×3): 1 [drp] via OPHTHALMIC

## 2023-08-01 SURGICAL SUPPLY — 9 items
CATARACT SUITE SIGHTPATH (MISCELLANEOUS) ×1
FEE CATARACT SUITE SIGHTPATH (MISCELLANEOUS) ×1 IMPLANT
GLOVE SRG 8 PF TXTR STRL LF DI (GLOVE) ×1 IMPLANT
GLOVE SURG ENC TEXT LTX SZ7.5 (GLOVE) ×1 IMPLANT
LENS IOL CLRN VT TRC 5 17.0 IMPLANT
LENS VIVITY TORIC CNWET5 17.0 ×1 IMPLANT
NDL FILTER BLUNT 18X1 1/2 (NEEDLE) ×1 IMPLANT
NEEDLE FILTER BLUNT 18X1 1/2 (NEEDLE) ×1
SYR 3ML LL SCALE MARK (SYRINGE) ×1 IMPLANT

## 2023-08-01 NOTE — Op Note (Signed)
 LOCATION:  [facility]   PREOPERATIVE DIAGNOSIS:  Nuclear sclerotic cataract of the left eye.  H25.12  POSTOPERATIVE DIAGNOSIS:  Nuclear sclerotic cataract of the left eye.   PROCEDURE:  Phacoemulsification with Toric posterior chamber intraocular lens placement of the left eye.  Ultrasound time: Procedure(s): CATARACT EXTRACTION PHACO AND INTRAOCULAR LENS PLACEMENT (IOC) LEFT  CLAREON VIVITY TORIC 10.13, 00:41.6 (Left)  LENS:   Implant Name Type Inv. Item Serial No. Manufacturer Lot No. LRB No. Used Action  LENS VIVITY TORIC [id number] 17.0 - [id number]  LENS VIVITY TORIC CNWET5 17.0 [id number] [employer]  Left 1 Implanted     Vivity ToricToric intraocular lens with 3.0 diopters of cylindrical power with axis orientation at 180 degrees.     SURGEON:  Dene FABIENE Etienne, MD   ANESTHESIA:  Topical with tetracaine  drops and 2% Xylocaine  jelly, augmented with 1% preservative-free intracameral lidocaine .  COMPLICATIONS:  None.   DESCRIPTION OF PROCEDURE:  The patient was identified in the holding room and transported to the operating suite and placed in the supine position under the operating microscope.  The left eye was identified as the operative eye, and it was prepped and draped in the usual sterile ophthalmic fashion.    A clear-corneal paracentesis incision was made at the 1:30 position.  0.5 ml of preservative-free 1% lidocaine  was injected into the anterior chamber. The anterior chamber was filled with Viscoat.  A 2.4 millimeter near clear corneal incision was then made at the 10:30 position.  A cystotome and capsulorrhexis forceps were then used to make a curvilinear capsulorrhexis.  Hydrodissection and hydrodelineation were then performed using balanced salt solution.   Phacoemulsification was then used in stop and chop fashion to remove the lens, nucleus and epinucleus.  The remaining cortex was aspirated using the irrigation and aspiration handpiece.  Provisc  viscoelastic was then placed into the capsular bag to distend it for lens placement.  The Verion digital marker was used to align the implant at the intended axis.   A Toric lens was then injected into the capsular bag.  It was rotated clockwise until the axis marks on the lens were approximately 15 degrees in the counterclockwise direction to the intended alignment.  The viscoelastic was aspirated from the eye using the irrigation aspiration handpiece.  Then, a Koch spatula through the sideport incision was used to rotate the lens in a clockwise direction until the axis markings of the intraocular lens were lined up with the Verion alignment.  Balanced salt solution was then used to hydrate the wounds. Cefuroxime  0.1 ml of a 10mg /ml solution was injected into the anterior chamber for a dose of 1 mg of intracameral antibiotic at the completion of the case.    The eye was noted to have a physiologic pressure and there was no wound leak noted.   Timolol  and Brimonidine  drops were applied to the eye.  The patient was taken to the recovery room in stable condition having had no complications of anesthesia or surgery.  Lenni Reckner 08/01/2023, 8:21 AM

## 2023-08-01 NOTE — H&P (Signed)
 [facility]   Primary Care Physician:  Porter Devere LABOR, MD Ophthalmologist: Dr. Dene Etienne  Pre-Procedure History & Physical: HPI:  Samantha Roy is a 73 y.o. female here for ophthalmic surgery.   Past Medical History:  Diagnosis Date   A-fib (HCC)    Asthma    Complication of anesthesia    Pt reports aggitation, severe headaches and chewed lips until they bled during/after a fib ablation   Family history of adverse reaction to anesthesia    Mother - wakes during procedures   Fatty liver    GERD (gastroesophageal reflux disease)    PONV (postoperative nausea and vomiting)    S/P ablation of atrial fibrillation    Situational anxiety    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HCC)    splenic    Past Surgical History:  Procedure Laterality Date   ATRIAL FIBRILLATION ABLATION N/A 08/09/2022   Procedure: ATRIAL FIBRILLATION ABLATION;  Surgeon: Nancey Eulas BRAVO, MD;  Location: [facility];  Service: Cardiovascular;  Laterality: N/A;   CATARACT EXTRACTION W/PHACO Right 07/11/2023   Procedure: CATARACT EXTRACTION PHACO AND INTRAOCULAR LENS PLACEMENT (IOC) RIGHT  CLAREON VIVITY TORIC 19.67 01:48.3;  Surgeon: Etienne Dene, MD;  Location: [facility];  Service: Ophthalmology;  Laterality: Right;   CESAREAN SECTION     IR RADIOLOGIST EVAL & MGMT  04/21/2021   IR RADIOLOGIST EVAL & MGMT  08/18/2021   IR RADIOLOGIST EVAL & MGMT  04/11/2022   IR RADIOLOGIST EVAL & MGMT  08/04/2022   IR RADIOLOGIST EVAL & MGMT  04/27/2023    Prior to Admission medications   Medication Sig Start Date End Date Taking? Authorizing Provider  albuterol  (VENTOLIN  HFA) 108 (90 Base) MCG/ACT inhaler Inhale 2 puffs into the lungs every 6 (six) hours as needed for wheezing or shortness of breath. Patient not taking: Reported on 07/03/2023    [provider]    Allergies as of 06/11/2023 - Review Complete 05/02/2023  Allergen Reaction Noted   Bee venom Anaphylaxis 02/03/2020    Codeine Swelling 02/22/2021   Eliquis  [apixaban ] Other (See Comments) 03/15/2023   Hydrocodone bit-homatrop mbr Other (See Comments) 02/27/2023   Molds & smuts Other (See Comments) 02/27/2023   Tilactase Diarrhea 09/06/2022   Latex Rash 01/04/2020    Family History  Problem Relation Age of Onset   Cancer Mother    Stroke Father    Cancer Father    Lung cancer Father    Hypertension Father    Hypertension Sister    Heart disease Sister 55   Heart attack Brother 41   Leukemia Maternal Grandmother    Depression Other    Bipolar disorder Other    Aneurysm Neg Hx     Social History   Socioeconomic History   Marital status: Widowed    Spouse name: Not on file   Number of children: Not on file   Years of education: Not on file   Highest education level: Not on file  Occupational History   Not on file  Tobacco Use   Smoking status: Never   Smokeless tobacco: Never   Tobacco comments:    Never smoke 09/06/22  Vaping Use   Vaping status: Never Used  Substance and Sexual Activity   Alcohol use: Never   Drug use: Never   Sexual activity: Not on file  Other Topics Concern   Not on file  Social History Narrative   Not on file   Social Drivers of Corporate Investment Banker  Strain: Low Risk  (06/07/2023)   Received from [facility]   Overall Financial Resource Strain (CARDIA)    Difficulty of Paying Living Expenses: Not hard at all  Food Insecurity: No Food Insecurity (07/04/2023)   Received from [facility]   Hunger Vital Sign    Worried About Running Out of Food in the Last Year: Never true    Ran Out of Food in the Last Year: Never true  Transportation Needs: No Transportation Needs (06/07/2023)   Received from [facility] - Transportation    In the past 12 months, has lack of transportation kept you from medical appointments or from getting medications?: No    Lack of Transportation (Non-Medical): No   Physical Activity: Sufficiently Active (07/04/2023)   Received from [facility]   Exercise Vital Sign    Days of Exercise per Week: 7 days    Minutes of Exercise per Session: 30 min  Stress: Stress Concern Present (07/04/2023)   Received from [facility] of Occupational Health - Occupational Stress Questionnaire    Feeling of Stress : Very much  Social Connections: Moderately Integrated (07/04/2023)   Received from [facility]   Social Connection and Isolation Panel [NHANES]    Frequency of Communication with Friends and Family: More than three times a week    Frequency of Social Gatherings with Friends and Family: More than three times a week    Attends Religious Services: More than 4 times per year    Active Member of [employer] or Organizations: Yes    Attends Banker Meetings: More than 4 times per year    Marital Status: Widowed  Catering Manager Violence: Not on file    Review of Systems: See HPI, otherwise negative ROS  Physical Exam: BP 117/65   Pulse 70   Temp (!) 97.2 F (36.2 C) (Temporal)   Resp (!) 23   Ht 5' 4.02 (1.626 m)   Wt 54.4 kg   SpO2 99%   BMI 20.59 kg/m  General:   Alert,  pleasant and cooperative in NAD Head:  Normocephalic and atraumatic. Lungs:  Clear to auscultation.    Heart:  Regular rate and rhythm.   Impression/Plan: Samantha Roy is here for ophthalmic surgery.  Risks, benefits, limitations, and alternatives regarding ophthalmic surgery have been reviewed with the patient.  Questions have been answered.  All parties agreeable.   MITTIE GASKIN, MD  08/01/2023, 7:35 AM

## 2023-08-01 NOTE — Anesthesia Postprocedure Evaluation (Signed)
 Anesthesia Post Note  Patient: Samantha Roy  Procedure(s) Performed: CATARACT EXTRACTION PHACO AND INTRAOCULAR LENS PLACEMENT (IOC) LEFT  CLAREON VIVITY TORIC 10.13, 00:41.6 (Left)  Patient location during evaluation: PACU Anesthesia Type: MAC Level of consciousness: awake and alert Pain management: pain level controlled Vital Signs Assessment: post-procedure vital signs reviewed and stable Respiratory status: spontaneous breathing, nonlabored ventilation, respiratory function stable and patient connected to nasal cannula oxygen Cardiovascular status: stable and blood pressure returned to baseline Postop Assessment: no apparent nausea or vomiting Anesthetic complications: no   No notable events documented.   Last Vitals:  Vitals:   08/01/23 0824 08/01/23 0829  BP: 111/70 110/64  Pulse: 63 (!) 59  Resp: 12 15  Temp: (!) 36.4 C (!) 36.4 C  SpO2: 99% 99%    Last Pain:  Vitals:   08/01/23 0829  TempSrc:   PainSc: 0-No pain                 Donny JAYSON Mu

## 2023-08-01 NOTE — Transfer of Care (Signed)
 Immediate Anesthesia Transfer of Care Note  Patient: Samantha Roy  Procedure(s) Performed: CATARACT EXTRACTION PHACO AND INTRAOCULAR LENS PLACEMENT (IOC) LEFT  CLAREON VIVITY TORIC 10.13, 00:41.6 (Left)  Patient Location: PACU  Anesthesia Type: MAC  Level of Consciousness: awake, alert  and patient cooperative  Airway and Oxygen Therapy: Patient Spontanous Breathing and Patient connected to supplemental oxygen  Post-op Assessment: Post-op Vital signs reviewed, Patient's Cardiovascular Status Stable, Respiratory Function Stable, Patent Airway and No signs of Nausea or vomiting  Post-op Vital Signs: Reviewed and stable  Complications: No notable events documented.

## 2023-08-02 ENCOUNTER — Ambulatory Visit (INDEPENDENT_AMBULATORY_CARE_PROVIDER_SITE_OTHER): Payer: Medicare Other

## 2023-08-02 DIAGNOSIS — I480 Paroxysmal atrial fibrillation: Secondary | ICD-10-CM

## 2023-08-03 LAB — CUP PACEART REMOTE DEVICE CHECK
Date Time Interrogation Session: 20250109125413
Implantable Pulse Generator Implant Date: 20241202

## 2023-08-04 ENCOUNTER — Encounter: Payer: Self-pay | Admitting: Ophthalmology

## 2023-09-06 ENCOUNTER — Ambulatory Visit (INDEPENDENT_AMBULATORY_CARE_PROVIDER_SITE_OTHER): Payer: Medicare Other

## 2023-09-06 DIAGNOSIS — I480 Paroxysmal atrial fibrillation: Secondary | ICD-10-CM | POA: Diagnosis not present

## 2023-09-07 LAB — CUP PACEART REMOTE DEVICE CHECK
Date Time Interrogation Session: 20250213125412
Implantable Pulse Generator Implant Date: 20241202

## 2023-09-08 ENCOUNTER — Encounter: Payer: Self-pay | Admitting: Cardiology

## 2023-09-11 NOTE — Addendum Note (Signed)
Addended by: Elease Etienne A on: 09/11/2023 04:04 PM   Modules accepted: Orders

## 2023-09-11 NOTE — Progress Notes (Signed)
 Carelink Summary Report / Loop Recorder

## 2023-10-09 NOTE — Progress Notes (Signed)
 Carelink Summary Report / Loop Recorder

## 2023-10-09 NOTE — Addendum Note (Signed)
 Addended by: Elease Etienne A on: 10/09/2023 02:19 PM   Modules accepted: Orders

## 2023-10-11 ENCOUNTER — Ambulatory Visit (INDEPENDENT_AMBULATORY_CARE_PROVIDER_SITE_OTHER): Payer: Medicare Other

## 2023-10-11 DIAGNOSIS — I480 Paroxysmal atrial fibrillation: Secondary | ICD-10-CM | POA: Diagnosis not present

## 2023-10-12 LAB — CUP PACEART REMOTE DEVICE CHECK
Date Time Interrogation Session: 20250320125411
Implantable Pulse Generator Implant Date: 20241202

## 2023-10-14 ENCOUNTER — Encounter: Payer: Self-pay | Admitting: Cardiology

## 2023-11-15 ENCOUNTER — Ambulatory Visit (INDEPENDENT_AMBULATORY_CARE_PROVIDER_SITE_OTHER): Payer: Medicare Other

## 2023-11-15 DIAGNOSIS — I480 Paroxysmal atrial fibrillation: Secondary | ICD-10-CM

## 2023-11-16 LAB — CUP PACEART REMOTE DEVICE CHECK
Date Time Interrogation Session: 20250424125410
Implantable Pulse Generator Implant Date: 20241202

## 2023-11-17 ENCOUNTER — Encounter: Payer: Self-pay | Admitting: Cardiology

## 2023-11-23 NOTE — Progress Notes (Signed)
 Carelink Summary Report / Loop Recorder

## 2023-11-23 NOTE — Addendum Note (Signed)
 Addended by: Lott Rouleau A on: 11/23/2023 09:15 AM   Modules accepted: Orders

## 2023-12-20 ENCOUNTER — Ambulatory Visit: Payer: Medicare Other

## 2023-12-20 DIAGNOSIS — I480 Paroxysmal atrial fibrillation: Secondary | ICD-10-CM

## 2023-12-20 LAB — CUP PACEART REMOTE DEVICE CHECK
Date Time Interrogation Session: 20250528233620
Implantable Pulse Generator Implant Date: 20241202

## 2023-12-23 ENCOUNTER — Ambulatory Visit: Payer: Self-pay | Admitting: Cardiology

## 2023-12-26 NOTE — Progress Notes (Signed)
 Carelink Summary Report / Loop Recorder

## 2023-12-26 NOTE — Addendum Note (Signed)
 Addended by: Lott Rouleau A on: 12/26/2023 12:08 PM   Modules accepted: Orders

## 2024-01-21 ENCOUNTER — Telehealth: Payer: Self-pay | Admitting: Cardiology

## 2024-01-21 ENCOUNTER — Ambulatory Visit

## 2024-01-21 DIAGNOSIS — I480 Paroxysmal atrial fibrillation: Secondary | ICD-10-CM | POA: Diagnosis not present

## 2024-01-21 LAB — CUP PACEART REMOTE DEVICE CHECK
Date Time Interrogation Session: 20250629235254
Implantable Pulse Generator Implant Date: 20241202

## 2024-01-21 NOTE — Telephone Encounter (Signed)
 Returned call to Pt.  Pt is concerned because she has been having increased foggy head and episodes where she feels like she may pass out.  She states when she has these feelings she has to lay down and sleep.  She states this has been happening more frequently, specifically over the last 6 weeks.    She denies chest spasms.  States she has intermittent twitches like needle pricks that are uncomfortable.  Reviewed latest transmission with Pt.  Advised her loop recorder did not show anything concerning.    Per Pt she is seeing her PCP tomorrow.  She will discuss further with her PCP.  She states she has been having increased glucose readings which may be contributing.    She was thankful for call and discussion.  She will call back if she has any further cardiac concerns.  Ok for leaving loop recorder for now.  Await further needs.

## 2024-01-21 NOTE — Telephone Encounter (Signed)
 Patient c/o Palpitations:  STAT if patient reporting lightheadedness, shortness of breath, or chest pain  How long have you had palpitations/irregular HR/ Afib? Are you having the symptoms now? Spasms in chest for a few weeks. She states it feels like her muscle is spasming. She states it is near loop recorder and wants to discuss taking it out.   Are you currently experiencing lightheadedness, SOB or CP? No   Do you have a history of afib (atrial fibrillation) or irregular heart rhythm? Yes   Have you checked your BP or HR? (document readings if available): br 67-89   Are you experiencing any other symptoms? Fogginess and fatigue. She states the fatigue makes her feel like she is going to pass out. She has not passed out though.

## 2024-01-23 ENCOUNTER — Ambulatory Visit: Payer: Self-pay | Admitting: Cardiology

## 2024-02-07 NOTE — Progress Notes (Signed)
 Carelink Summary Report / Loop Recorder

## 2024-02-13 ENCOUNTER — Telehealth: Payer: Self-pay | Admitting: Cardiology

## 2024-02-13 NOTE — Telephone Encounter (Signed)
 Pt called in asking to make an appt to have loop recorder removed. Please advise.

## 2024-02-13 NOTE — Telephone Encounter (Signed)
 Patient identification verified by 2 forms.   Called and spoke to patient  Patient states:  -I'm just done  -It has always irritated and bothered me.  -Pt recently had a mammogram and it has irritated more due to the compression of her breast -Pt is a side sleeper but it causes her pain when laying down

## 2024-02-13 NOTE — Telephone Encounter (Signed)
 Left message for patient to call back

## 2024-02-14 NOTE — Telephone Encounter (Signed)
 Patient returned RN's call.

## 2024-02-14 NOTE — Telephone Encounter (Signed)
 Spoke with the patient who states that she wants her loop recorder taken out because it is causing a lot of discomfort. Appointment has been scheduled with Dr. Cindie to discuss and for possible explant.

## 2024-02-21 ENCOUNTER — Ambulatory Visit (INDEPENDENT_AMBULATORY_CARE_PROVIDER_SITE_OTHER)

## 2024-02-21 DIAGNOSIS — I4891 Unspecified atrial fibrillation: Secondary | ICD-10-CM

## 2024-02-21 DIAGNOSIS — I480 Paroxysmal atrial fibrillation: Secondary | ICD-10-CM

## 2024-02-21 LAB — CUP PACEART REMOTE DEVICE CHECK
Date Time Interrogation Session: 20250730233933
Implantable Pulse Generator Implant Date: 20241202

## 2024-02-25 ENCOUNTER — Ambulatory Visit: Payer: Self-pay | Admitting: Cardiology

## 2024-02-26 NOTE — Telephone Encounter (Signed)
 Patient is calling back because she said the pain is worst. Looking to come in asap. Please advise

## 2024-02-26 NOTE — Telephone Encounter (Signed)
 Spoke with the patient and moved up her appointment with Dr. Cindie to have her loop recorder explanted. She states that it is really bothering her and feels like it is pressing up against a bone.

## 2024-02-28 ENCOUNTER — Ambulatory Visit: Admitting: Cardiology

## 2024-03-18 NOTE — Progress Notes (Unsigned)
 Electrophysiology Office Follow up Visit Note:    Date:  03/19/2024   ID:  Samantha Roy, DOB 21-Oct-1950, MRN [id number]  PCP:  Porter Devere LABOR, MD  [facility] Cardiologist:  Powell FORBES Sorrow, MD (Inactive)  [facility] Electrophysiologist:  OLE ONEIDA HOLTS, MD    Interval History:     Samantha Roy is a 73 y.o. female who presents for a follow up visit.   I last saw the patient June 25, 2023.  She has atrial fibrillation with a prior catheter ablation by Dr. Nancey.  A loop recorder was implanted at the December 2020 for appointment in an effort to monitor her rhythm and avoid indefinite exposure to anticoagulation.  She sent a message in recently reporting pain at the loop recorder site.  She wanted to have the loop recorder explanted.        Past medical, surgical, social and family history were reviewed.  ROS:   Please see the history of present illness.    All other systems reviewed and are negative.  EKGs/Labs/Other Studies Reviewed:    The following studies were reviewed today:          Physical Exam:    VS:  BP 121/70   Pulse 64   Ht 5' 4 (1.626 m)   Wt 123 lb (55.8 kg)   SpO2 96%   BMI 21.11 kg/m     Wt Readings from Last 3 Encounters:  03/19/24 123 lb (55.8 kg)  08/01/23 120 lb (54.4 kg)  07/11/23 117 lb 12.8 oz (53.4 kg)     GEN: no distress CARD: RRR, No MRG RESP: No IWOB. CTAB.      ASSESSMENT:    1. Atrial fibrillation, unspecified type (HCC)   2. Palpitations    PLAN:    In order of problems listed above:  #Loop recorder in situ #Chest wall pain Patient requests loop recorder explant today given pain around the site. Loop recorder has shown no arrhythmias on recent monitoring.  We discussed using alternative rhythm monitor such as a wearable device (Apple or Samsung watch) after removing the loop recorder.  I discussed the loop recorder explant procedure in detail including the risks and recovery and she  wishes to proceed.  Signed, OLE HOLTS, MD, [facility], [facility] 03/19/2024 4:32 PM    Electrophysiology [facility]  -----------------  SURGEON:  OLE HOLTS, MD    PREPROCEDURE DIAGNOSIS: Atrial fibrillation    POSTPROCEDURE DIAGNOSIS: Atrial fibrillation     PROCEDURES:   1. Implantable loop recorder explantation    INTRODUCTION:  Samantha Roy is a 73 y.o. female with a history of atrial fibrillation who presents today for implantable loop explantation.  A preprocedure timeout was performed with nurse, Carly.      DESCRIPTION OF PROCEDURE:  Informed written consent was obtained.  The patient required no sedation for the procedure today.   The patients left chest was therefore prepped and draped in the usual sterile fashion.  The skin overlying the ILR monitor was infiltrated with lidocaine  for local analgesia.  A 0.5-cm incision was made over the site.  The previously implanted ILR was exposed and removed using a combination of sharp and blunt dissection.  Steri- Strips and a sterile dressing were then applied. EBL<44ml.  There were no early apparent complications.     CONCLUSIONS:   1. Successful explantation of an implantable loop recorder   2. No early apparent complications.        OLE HOLTS,  MD 03/19/2024 4:32 PM

## 2024-03-19 ENCOUNTER — Encounter: Payer: Self-pay | Admitting: Cardiology

## 2024-03-19 ENCOUNTER — Ambulatory Visit: Attending: Cardiology | Admitting: Cardiology

## 2024-03-19 VITALS — BP 121/70 | HR 64 | Ht 64.0 in | Wt 123.0 lb

## 2024-03-19 DIAGNOSIS — I4891 Unspecified atrial fibrillation: Secondary | ICD-10-CM | POA: Insufficient documentation

## 2024-03-19 DIAGNOSIS — R002 Palpitations: Secondary | ICD-10-CM | POA: Insufficient documentation

## 2024-03-19 NOTE — Patient Instructions (Signed)
Medication Instructions:  Your physician recommends that you continue on your current medications as directed. Please refer to the Current Medication list given to you today.  Labwork: None ordered.  Testing/Procedures: None ordered.  Follow-Up:  As needed with Dr. Lalla Brothers    Implantable Loop Recorder Removal, Care After This sheet gives you information about how to care for yourself after your procedure. Your health care provider may also give you more specific instructions. If you have problems or questions, contact your health care provider. What can I expect after the procedure? After the procedure, it is common to have: Soreness or discomfort near the incision. Some swelling or bruising near the incision.  Follow these instructions at home: Incision care  Monitor your cardiac device site for redness, swelling, and drainage. Call the device clinic at [phone] if you experience these symptoms or fever/chills.  Keep the large square bandage on your site for 24 hours and then you may remove it yourself. Keep the steri-strips underneath in place.   You may shower after 72 hours / 3 days from your procedure with the steri-strips in place. They will usually fall off on their own, or may be removed after 10 days. Pat dry.   Avoid lotions, ointments, or perfumes over your incision until it is well-healed.  Please do not submerge in water until your site is completely healed.   If your wound site starts to bleed apply pressure.       If you have any questions/concerns please call the device clinic at [phone].  Activity  Return to your normal activities.  Contact a health care provider if: You have redness, swelling, or pain around your incision. You have a fever.

## 2024-03-24 ENCOUNTER — Ambulatory Visit

## 2024-03-24 DIAGNOSIS — I4891 Unspecified atrial fibrillation: Secondary | ICD-10-CM | POA: Diagnosis not present

## 2024-03-26 LAB — CUP PACEART REMOTE DEVICE CHECK
Date Time Interrogation Session: 20250830232707
Implantable Pulse Generator Implant Date: 20241202

## 2024-03-29 ENCOUNTER — Ambulatory Visit: Payer: Self-pay | Admitting: Cardiology

## 2024-04-01 NOTE — Progress Notes (Signed)
 Remote Loop Recorder Transmission

## 2024-04-22 NOTE — Progress Notes (Signed)
 Remote Loop Recorder Transmission

## 2024-04-23 ENCOUNTER — Ambulatory Visit: Admitting: Cardiology

## 2024-04-23 NOTE — Progress Notes (Signed)
 Remote Loop Recorder Transmission

## 2024-04-24 ENCOUNTER — Encounter

## 2024-05-15 ENCOUNTER — Other Ambulatory Visit: Payer: Self-pay | Admitting: Interventional Radiology

## 2024-05-15 DIAGNOSIS — I728 Aneurysm of other specified arteries: Secondary | ICD-10-CM

## 2024-05-26 ENCOUNTER — Ambulatory Visit

## 2024-05-26 ENCOUNTER — Ambulatory Visit
Admission: RE | Admit: 2024-05-26 | Discharge: 2024-05-26 | Disposition: A | Discharge status: Home / Self Care | Source: Ambulatory Visit | Attending: Interventional Radiology | Admitting: Interventional Radiology

## 2024-05-26 DIAGNOSIS — I728 Aneurysm of other specified arteries: Secondary | ICD-10-CM

## 2024-05-26 MED ORDER — IOPAMIDOL (ISOVUE-370) INJECTION 76%
125.0000 mL | Freq: Once | INTRAVENOUS | Status: AC | PRN
  Administered 2024-05-26: 125 mL via INTRAVENOUS

## 2024-06-05 ENCOUNTER — Ambulatory Visit
Admission: RE | Admit: 2024-06-05 | Discharge: 2024-06-05 | Disposition: A | Source: Ambulatory Visit | Attending: Interventional Radiology | Admitting: Interventional Radiology

## 2024-06-05 DIAGNOSIS — I728 Aneurysm of other specified arteries: Secondary | ICD-10-CM

## 2024-06-05 HISTORY — PX: IR RADIOLOGIST EVAL & MGMT: IMG5224

## 2024-06-05 NOTE — Progress Notes (Signed)
 Chief Complaint: Patient was seen in consultation today for splenic artery aneurysm at the request of Jorie Zee K  Referring Physician(s): Warren Bellman, DO  History of Present Illness: Samantha Roy is a 73 y.o. female With a history of an incidentally discovered splenic artery aneurysm.  She was first evaluated by my partner, Dr. Warren Bellman in September 2022.  Together, they decided on surveillance rather than treatment.  She has now undergone multiple sequential CT arteriograms of the abdomen annually.  Her peripherally calcified aneurysm has remained rock stable at 2.3 cm in maximal dimension.  She comes into clinic today for follow-up.  I have assumed her care.  She is in remarkable shape for her age and is an active hiker.  She has no abdominal pain or symptoms related to her splenic artery aneurysm.  We discussed the current treatment strategy of imaging surveillance and she remains committed to this.  She told me she has no interest in pursuing any type of treatment unless it is absolutely necessary.  We talked about what she may experience if she were ever to develop a spontaneous hemorrhage.  Past Medical History:  Diagnosis Date   A-fib (HCC)    Asthma    Complication of anesthesia    Pt reports aggitation, severe headaches and chewed lips until they bled during/after a fib ablation   Family history of adverse reaction to anesthesia    Mother - wakes during procedures   Fatty liver    GERD (gastroesophageal reflux disease)    PONV (postoperative nausea and vomiting)    S/P ablation of atrial fibrillation    Situational anxiety    Visceral aneurysm    splenic    Past Surgical History:  Procedure Laterality Date   ATRIAL FIBRILLATION ABLATION N/A 08/09/2022   Procedure: ATRIAL FIBRILLATION ABLATION;  Surgeon: Nancey Eulas BRAVO, MD;  Location: [facility];  Service: Cardiovascular;  Laterality: N/A;   CATARACT EXTRACTION W/PHACO Right 07/11/2023    Procedure: CATARACT EXTRACTION PHACO AND INTRAOCULAR LENS PLACEMENT (IOC) RIGHT  CLAREON VIVITY TORIC 19.67 01:48.3;  Surgeon: Mittie Gaskin, MD;  Location: [facility];  Service: Ophthalmology;  Laterality: Right;   CATARACT EXTRACTION W/PHACO Left 08/01/2023   Procedure: CATARACT EXTRACTION PHACO AND INTRAOCULAR LENS PLACEMENT (IOC) LEFT  CLAREON VIVITY TORIC 10.13, 00:41.6;  Surgeon: Mittie Gaskin, MD;  Location: [facility];  Service: Ophthalmology;  Laterality: Left;   CESAREAN SECTION     IR RADIOLOGIST EVAL & MGMT  04/21/2021   IR RADIOLOGIST EVAL & MGMT  08/18/2021   IR RADIOLOGIST EVAL & MGMT  04/11/2022   IR RADIOLOGIST EVAL & MGMT  08/04/2022   IR RADIOLOGIST EVAL & MGMT  04/27/2023   IR RADIOLOGIST EVAL & MGMT  06/05/2024    Allergies: Bee venom, Codeine, Eliquis  [apixaban ], Hydrocodone bit-homatrop mbr, Molds & smuts, Other, Tilactase, and Latex  Medications: Prior to Admission medications   Medication Sig Start Date End Date Taking? Authorizing Provider  albuterol  (VENTOLIN  HFA) 108 (90 Base) MCG/ACT inhaler Inhale 2 puffs into the lungs every 6 (six) hours as needed for wheezing or shortness of breath. Patient not taking: Reported on 03/19/2024    [provider]     Family History  Problem Relation Age of Onset   Cancer Mother    Stroke Father    Cancer Father    Lung cancer Father    Hypertension Father    Hypertension Sister    Heart disease Sister 47  Heart attack Brother 42   Leukemia Maternal Grandmother    Depression Other    Bipolar disorder Other    Aneurysm Neg Hx     Social History   Socioeconomic History   Marital status: Widowed    Spouse name: Not on file   Number of children: Not on file   Years of education: Not on file   Highest education level: Not on file  Occupational History   Not on file  Tobacco Use   Smoking status: Never   Smokeless tobacco: Never   Tobacco comments:    Never smoke 09/06/22   Vaping Use   Vaping status: Never Used  Substance and Sexual Activity   Alcohol use: Never   Drug use: Never   Sexual activity: Not on file  Other Topics Concern   Not on file  Social History Narrative   Not on file   Social Drivers of Health   Financial Resource Strain: Low Risk  (02/27/2024)   Received from [facility]   Overall Financial Resource Strain (CARDIA)    Difficulty of Paying Living Expenses: Not hard at all  Food Insecurity: No Food Insecurity (02/27/2024)   Received from [facility]   Hunger Vital Sign    Within the past 12 months, you worried that your food would run out before you got the money to buy more.: Never true    Within the past 12 months, the food you bought just didn't last and you didn't have money to get more.: Never true  Transportation Needs: No Transportation Needs (02/27/2024)   Received from [facility] - Transportation    In the past 12 months, has lack of transportation kept you from medical appointments or from getting medications?: No    Lack of Transportation (Non-Medical): No  Physical Activity: Sufficiently Active (07/04/2023)   Received from [facility]   Exercise Vital Sign    On average, how many days per week do you engage in moderate to strenuous exercise (like a brisk walk)?: 7 days    On average, how many minutes do you engage in exercise at this level?: 30 min  Stress: Stress Concern Present (07/04/2023)   Received from [facility] - Occupational Stress Questionnaire    Feeling of Stress : Very much  Social Connections: Moderately Integrated (07/04/2023)   Received from [facility]   Social Connection and Isolation Panel    In a typical week, how many times do you talk on the phone with family, friends, or neighbors?: More than three times a week    How often do you get  together with friends or relatives?: More than three times a week    How often do you attend church or religious services?: More than 4 times per year    Do you belong to any clubs or organizations such as church groups, unions, fraternal or athletic groups, or school groups?: Yes    How often do you attend meetings of the clubs or organizations you belong to?: More than 4 times per year    Are you married, widowed, divorced, separated, never married, or living with a partner?: Widowed   Review of Systems: A 12 point ROS discussed and pertinent positives are indicated in the HPI above.  All other systems are negative.  Review of Systems  Vital Signs: BP 131/64   Pulse 66  Temp 98 F (36.7 C)   Resp 15   SpO2 98%   Advance Care Plan: The advanced care plan/surrogate decision maker was discussed at the time of visit and the patient did not wish to discuss or was not able to name a surrogate decision maker or provide an advance care plan.    Physical Exam Constitutional:      General: She is not in acute distress.    Appearance: Normal appearance. She is normal weight.  HENT:     Head: Normocephalic and atraumatic.  Eyes:     General: No scleral icterus. Cardiovascular:     Rate and Rhythm: Normal rate.  Pulmonary:     Effort: Pulmonary effort is normal.  Abdominal:     General: Abdomen is flat. There is no distension.     Tenderness: There is no abdominal tenderness. There is no guarding.  Skin:    General: Skin is warm and dry.  Neurological:     Mental Status: She is alert and oriented to person, place, and time.  Psychiatric:        Mood and Affect: Mood normal.        Behavior: Behavior normal.       Imaging: IR Radiologist Eval & Mgmt Result Date: 06/05/2024 EXAM: NEW PATIENT OFFICE VISIT CHIEF COMPLAINT: SEE NOTE IN EPIC HISTORY OF PRESENT ILLNESS: SEE NOTE IN EPIC REVIEW OF SYSTEMS: SEE NOTE IN EPIC PHYSICAL EXAMINATION: SEE NOTE IN EPIC ASSESSMENT AND PLAN:  SEE NOTE IN EPIC Electronically Signed   By: Wilkie Lent M.D.   On: 06/05/2024 10:17   CT Angio Abd/Pel w/ and/or w/o Result Date: 05/29/2024 EXAM: CTA ABDOMEN AND PELVIS WITHOUT AND WITH CONTRAST 05/26/2024 10:26:47 AM TECHNIQUE: CTA images of the abdomen and pelvis without and with intravenous contrast. Three-dimensional MIP/volume rendered formations were performed. Automated exposure control, iterative reconstruction, and/or weight based adjustment of the mA/kV was utilized to reduce the radiation dose to as low as reasonably achievable. CONTRAST: 125 mL of ISOVUE  370. COMPARISON: CTA Abdomen and Pelvis 02/23/2021. CLINICAL HISTORY: Splenic artery aneurysm. FINDINGS: VASCULATURE: AORTA: Aortic atherosclerotic calcification. No acute finding. No abdominal aortic aneurysm. No dissection. CELIAC TRUNK: Similar splenic artery aneurysm with rim calcification measuring 2.4 cm. No acute finding. No occlusion or significant stenosis. SUPERIOR MESENTERIC ARTERY: No acute finding. No occlusion or significant stenosis. RENAL ARTERIES: No acute finding. No occlusion or significant stenosis. ILIAC ARTERIES: No acute finding. No occlusion or significant stenosis. LIVER: Unchanged hepatic cysts. GALLBLADDER AND BILE DUCTS: Gallbladder is unremarkable. No biliary ductal dilatation. SPLEEN: The spleen is unremarkable. PANCREAS: The pancreas is unremarkable. ADRENAL GLANDS: Bilateral adrenal glands demonstrate no acute abnormality. KIDNEYS, URETERS AND BLADDER: No stones in the kidneys or ureters. No hydronephrosis. No perinephric or periureteral stranding. Urinary bladder is unremarkable. GI AND BOWEL: Colonic diverticulosis without diverticulitis. Normal appendix. Stomach and duodenal sweep demonstrate no acute abnormality. There is no bowel obstruction. No abnormal bowel wall thickening or distension. REPRODUCTIVE: Reproductive organs are unremarkable. Tubal ligation clips. PERITONEUM AND RETRPERITONEUM: No ascites or  free air. LUNG BASE: No acute abnormality. LYMPH NODES: No lymphadenopathy. BONES AND SOFT TISSUES: No acute abnormality of the bones. No acute soft tissue abnormality. IMPRESSION: 1. Similar splenic artery aneurysm with rim calcification measuring 2.4 cm, unchanged from 02/23/2021. Recommend continued care with vascular specialist. Electronically signed by: Norman Gatlin MD 05/29/2024 08:57 PM EST RP Workstation: [id number]    Labs:  CBC: No results for input(s): WBC, HGB, HCT, PLT in the  last 8760 hours.  COAGS: No results for input(s): INR, APTT in the last 8760 hours.  BMP: No results for input(s): NA, K, CL, CO2, GLUCOSE, BUN, CALCIUM, CREATININE, GFRNONAA, GFRAA in the last 8760 hours.  Invalid input(s): CMP  LIVER FUNCTION TESTS: No results for input(s): BILITOT, AST, ALT, ALKPHOS, PROT, ALBUMIN in the last 8760 hours.  TUMOR MARKERS: No results for input(s): AFPTM, CEA, CA199, CHROMGRNA in the last 8760 hours.  Assessment and Plan:  Extremely pleasant and active 73 year old female with a chronic splenic artery aneurysm which remains stable on imaging surveillance.  I revisited with her the plan that she had with Dr. Alona for continued surveillance.  She remains committed to this strategy and has no interest in pursuing any surgery or procedures unless absolutely necessary (clear evidence of enlargement on imaging, or active bleeding).  1.)  CTA of the abdomen and accompanying clinic visit in 1 years time.    Electronically Signed: Wilkie POUR Vishruth Seoane 06/05/2024, 11:01 AM   I spent a total of  15 Minutes in face to face in clinical consultation, greater than 50% of which was counseling/coordinating care for splenic artery aneurysm

## 2024-06-06 NOTE — Telephone Encounter (Signed)
 ERROR

## 2024-06-26 ENCOUNTER — Encounter

## 2024-07-28 ENCOUNTER — Encounter

## 2024-08-28 ENCOUNTER — Encounter

## 2024-09-29 ENCOUNTER — Encounter

## 2024-10-30 ENCOUNTER — Encounter

## 2024-12-01 ENCOUNTER — Encounter

## 2025-01-01 ENCOUNTER — Encounter

## 2025-02-02 ENCOUNTER — Encounter

## 2025-03-05 ENCOUNTER — Encounter

## 2025-04-06 ENCOUNTER — Encounter
